# Patient Record
Sex: Male | Born: 1985 | Marital: Married | Smoking: Current every day smoker
Health system: Southern US, Community
[De-identification: ages and names within clinical notes are randomized; demographics above are authoritative.]

## PROBLEM LIST (undated history)

## (undated) DIAGNOSIS — E785 Hyperlipidemia, unspecified: Secondary | ICD-10-CM

## (undated) DIAGNOSIS — M419 Scoliosis, unspecified: Secondary | ICD-10-CM

## (undated) HISTORY — DX: Scoliosis, unspecified: M41.9

## (undated) HISTORY — PX: OTHER SURGICAL HISTORY: SHX169

## (undated) HISTORY — DX: Hyperlipidemia, unspecified: E78.5

---

## 2018-05-18 DIAGNOSIS — N481 Balanitis: Secondary | ICD-10-CM | POA: Diagnosis not present

## 2018-06-15 DIAGNOSIS — N481 Balanitis: Secondary | ICD-10-CM | POA: Diagnosis not present

## 2018-06-15 DIAGNOSIS — N471 Phimosis: Secondary | ICD-10-CM | POA: Diagnosis not present

## 2018-06-21 DIAGNOSIS — H40053 Ocular hypertension, bilateral: Secondary | ICD-10-CM | POA: Diagnosis not present

## 2018-06-21 DIAGNOSIS — H35033 Hypertensive retinopathy, bilateral: Secondary | ICD-10-CM | POA: Diagnosis not present

## 2018-06-21 DIAGNOSIS — H43393 Other vitreous opacities, bilateral: Secondary | ICD-10-CM | POA: Diagnosis not present

## 2018-06-28 DIAGNOSIS — R448 Other symptoms and signs involving general sensations and perceptions: Secondary | ICD-10-CM | POA: Diagnosis not present

## 2018-06-28 DIAGNOSIS — J3489 Other specified disorders of nose and nasal sinuses: Secondary | ICD-10-CM | POA: Diagnosis not present

## 2018-06-28 DIAGNOSIS — H93A2 Pulsatile tinnitus, left ear: Secondary | ICD-10-CM | POA: Diagnosis not present

## 2018-06-29 ENCOUNTER — Ambulatory Visit (INDEPENDENT_AMBULATORY_CARE_PROVIDER_SITE_OTHER): Payer: 59 | Admitting: Family Medicine

## 2018-06-29 ENCOUNTER — Encounter: Payer: Self-pay | Admitting: Family Medicine

## 2018-06-29 ENCOUNTER — Encounter

## 2018-06-29 VITALS — BP 114/70 | HR 71 | Temp 98.6°F | Ht 69.0 in | Wt 251.0 lb

## 2018-06-29 DIAGNOSIS — H43393 Other vitreous opacities, bilateral: Secondary | ICD-10-CM | POA: Diagnosis not present

## 2018-06-29 DIAGNOSIS — H0289 Other specified disorders of eyelid: Secondary | ICD-10-CM | POA: Diagnosis not present

## 2018-06-29 DIAGNOSIS — M419 Scoliosis, unspecified: Secondary | ICD-10-CM | POA: Insufficient documentation

## 2018-06-29 DIAGNOSIS — F17200 Nicotine dependence, unspecified, uncomplicated: Secondary | ICD-10-CM

## 2018-06-29 DIAGNOSIS — E785 Hyperlipidemia, unspecified: Secondary | ICD-10-CM

## 2018-06-29 DIAGNOSIS — N4889 Other specified disorders of penis: Secondary | ICD-10-CM | POA: Diagnosis not present

## 2018-06-29 DIAGNOSIS — R1012 Left upper quadrant pain: Secondary | ICD-10-CM

## 2018-06-29 NOTE — Progress Notes (Signed)
Phone: [phone]  Subjective:  Patient presents today to establish care.  Prior patient with out of state PCP. Chief complaint-noted.   See problem oriented charting  The following were reviewed and entered/updated in epic: Past Medical History:  Diagnosis Date  . Hyperlipidemia    no rx  . Kyphoscoliosis    diagnosed as teengager- wore brace for some time- didnt seem to hepl    Patient Active Problem List   Diagnosis Date Noted  . Hyperlipidemia     Priority: Medium  . Kyphoscoliosis     Priority: Low   Past Surgical History:  Procedure Laterality Date  . none      Family History  Problem Relation Age of Onset  . Heart attack Father        46, smoker  . Hypertension Father   . Hyperlipidemia Father   . Lung cancer Maternal Grandfather        smoker  . Healthy Mother   . Healthy Sister        nosebleeds, polychromasia in blood- seeing hematologist  . Arthritis Maternal Grandmother   . Other Paternal Grandmother        unknown history  . Other Paternal Grandfather        unknown history- possible heart issues     Medications- reviewed and updated Current Outpatient Medications  Medication Sig Dispense Refill  . Triamcinolone Acetonide (TRIAMCINOLONE 0.1 % CREAM : EUCERIN) CREA Apply 1 application topically.     No current facility-administered medications for this visit.     Allergies-reviewed and updated No Known Allergies  Social History   Social History Narrative   Married. 1 daughter 3.5 years in 2019.       IT consultant      Hobbies: time with daughter    ROS--Full ROS was completed Review of Systems  Constitutional: Positive for weight loss (intentional). Negative for chills and fever.  HENT: Positive for tinnitus. Negative for ear discharge, ear pain and nosebleeds.   Eyes: Negative for blurred vision and double vision.  Respiratory: Negative for cough and shortness of breath.   Cardiovascular: Negative for chest pain and  palpitations.  Gastrointestinal: Positive for abdominal pain and diarrhea. Negative for nausea and vomiting.  Genitourinary: Negative for flank pain and hematuria.  Musculoskeletal: Positive for myalgias. Negative for falls.  Skin: Negative for itching and rash.  Neurological: Positive for tingling and headaches. Negative for speech change.  Endo/Heme/Allergies: Negative for polydipsia. Does not bruise/bleed easily.  Psychiatric/Behavioral: Negative for hallucinations and substance abuse.   Objective: BP 114/70 (BP Location: Left Arm, Patient Position: Sitting, Cuff Size: Large)   Pulse 71   Temp 98.6 F (37 C) (Oral)   Ht 5\' 9"  (1.753 m)   Wt 251 lb (113.9 kg)   SpO2 96%   BMI 37.07 kg/m  Gen: NAD, resting comfortably HEENT: Mucous membranes are moist. Oropharynx normal. TM normal. Some brown spots on lips Eyes: sclera and lids normal, PERRLA Neck: no thyromegaly, no cervical lymphadenopathy CV: RRR no murmurs rubs or gallops Lungs: CTAB no crackles, wheeze, rhonchi Abdomen: soft/nontender/nondistended/normal bowel sounds. No rebound or guarding. obese Ext: no edema Skin: warm, dry, 4 brown lesions all under 1 cm on bilateral legs Neuro: 5/5 strength in upper and lower extremities, normal gait, normal reflexes  Assessment/Plan:   Eyelid discomfort Penile Burning LUQ abdominal pain S: in 2014 had a dental cleaning for gingivitis and had deep cleaning. All of his symptoms started after the deep cleaning. He felt "  inflammed" in multiple areas- hip, stomach, penis burning, left eyelid. Then he started getting bad headaches- felt like a balloon being inflamed in his brain. Later had blurry vision- ended up at hospital in 2014- did MRI brain and possibly CT scan- these were reassuring   Worried about HIV- had this ruled out. He saw multiple doctors- GI (told GERD or IBS), neurologist (who mentioned fibromyalgia or psychosomatic), urologist (phimosis noted in 2014 and used cream,  later urology locally- skin pulled back further and was told chronic inflammation in area of brown spot- also has some inflammation near penile tip). He will be following up with a new urologist as his is moving. Has seen optho (signs of allergic symptoms).   ENT locally saw Dr. Blenda Nicely who did another scope- she didn't see anything that needed to be biopsied. She saw large adenoids and possibly that's what he wanted to biopsy in 2009. They also discussed hearing test and MRI.   In 2009 went to ENT for unrelated visit- he did a scope in his nose- they were worried about lymphoma- He felt better after steroids and never followed up. Talked to PCP who said if he had lymphoma for 5 years - wouldn't present with current symptoms of diffuse feeling of inflammation. Some tinnitus- worse with sex- sounds like swooshing- doesn't line up with heart beat  Current symptoms include penile tingling/burning (STD check up in 2014, sexually active with wife only and doesn't suspect infidelity) that comes and goes, eyes always feel inflamed to him and he has some floaters (has seen eye doctor here locally), skin over left upper abdomen feels like balloon inflammed then will disappear (will get dull aches)- nothing at present. Generally feels hot in comparison to others in last few months. Some weight loss but intentional. Infrequent diarrhea. Thyroid has been checked before and normal. Also feels like he sweats a lot. Hyperpigmented areas on lips- ENT doctor saw them and stated they could be biopsied. Mild tingling over each cheek with mild redness. headahes seem to be starting up again- behind both ears. Flonase, zyrtec, no help for burning in eyelids.   He states has had multiple tests like ANA, p anca, c anca and others.   Excessive sweating has been lifelong issue. Has felt tired at times but admits doesn't exerise regularly. Black spots in mouth. Some brown spots on legs- possible Dermatofibroma on leg  A/P: 32  year old male with eyelid burning, penile burning (thought to be related to phimosis per urology), floaters (has seen optho), intermittent LUQ pain- though none today. Has some other and has had even other nonspecific symptoms. He is concerned about all of these symptoms being linked lymphoma which was mentioned by [facility].  Doctor in [place] seemed to have more advanced equipment and exam took longer.   Patient is very concerned about potential for lymphoma and wants to get to the root of all his issues. We talked about doing an MRI sinuses/orbits. He states even if this was normal would not fully reassure him. Talked about returning to [place] ENT doctor who is now in [place]- then he states that he reviewed that doctors notes and they were not very in depth/precise and he is not sure if he would be reassured by another examination by that physician.   I am not sure what would provide peace of mine to patient in regards to multiple nonspecific symptoms. I certainly want to rule out any serious disease like lymphoma. We are going to  start by getting prior workup/records. Reassess in 1 month. Will get labwork at that time and consider MR orbits.   In regards to eyelid burning- no obvious findings. Also mentioned some tingling in cheeks and red rash -not a classic malar rash- may be worth updating ANA if cant find in records. Abdominal exam benign today. Didn't examine penis as following with urology. He has a lesion on his leg which he wonders if could be linked with lymphoma- I think one of these is a dermatofibroma- could biopsy this with punch biopsy potentially vs refer to dermatology- discuss further at follow up. Has some brown spots on lips that he declines biopsy of through ENT.    Need to push for smoking cessation as this is a clear risk factor for MI and dad had heart attack at 69 and given his cancer concern need to reduce cancer risk factors.   Hyperlipidemia S: suspect controlled on no rx A/P: will get  records, may need to get more recent lipids when he comes back for follow up      Future Appointments  Date Time Provider [place]  08/01/2018 11:15 AM Marin Olp, MD [facility]   Time Stamp The duration of face-to-face time during this visit was greater than 30 minutes. Greater than 50% of this time was spent in counseling, explanation of diagnosis, planning of further management, and/or coordination of care including discussion of stress of prior and current symptoms and not knowing current solution, discussing potential next steps, reassuring patient about prior workup, discussing how sometimes we dont find causes and how that can be frustrating for both patient and physician.    Return precautions advised.   Garret Reddish, MD

## 2018-06-29 NOTE — Patient Instructions (Addendum)
Sign release of information at the check out desk for 1. Records from first ENT doctor 2. Prior neurologist 3. Prior gastroenterologist 4. Prior primary care doctor 5. Prior dermatologist visit  Once we have all that we will determine if future blood tests needed, can consider MR orbits/sinuses, can consider derm referral vs. Biopsy of lesion on leg but want to look at prior workup first

## 2018-06-29 NOTE — Assessment & Plan Note (Signed)
S: suspect controlled on no rx A/P: will get records, may need to get more recent lipids when he comes back for follow up

## 2018-07-12 ENCOUNTER — Ambulatory Visit (INDEPENDENT_AMBULATORY_CARE_PROVIDER_SITE_OTHER): Payer: 59 | Admitting: Family Medicine

## 2018-07-12 ENCOUNTER — Other Ambulatory Visit (HOSPITAL_COMMUNITY)
Admission: RE | Admit: 2018-07-12 | Discharge: 2018-07-12 | Disposition: A | Discharge status: Home / Self Care | Payer: 59 | Source: Ambulatory Visit | Attending: Family Medicine | Admitting: Family Medicine

## 2018-07-12 ENCOUNTER — Encounter: Payer: Self-pay | Admitting: Family Medicine

## 2018-07-12 VITALS — BP 118/68 | HR 65 | Temp 99.1°F | Ht 69.0 in | Wt 253.4 lb

## 2018-07-12 DIAGNOSIS — Z113 Encounter for screening for infections with a predominantly sexual mode of transmission: Secondary | ICD-10-CM | POA: Insufficient documentation

## 2018-07-12 DIAGNOSIS — R51 Headache: Secondary | ICD-10-CM

## 2018-07-12 DIAGNOSIS — Z114 Encounter for screening for human immunodeficiency virus [HIV]: Secondary | ICD-10-CM

## 2018-07-12 DIAGNOSIS — G8929 Other chronic pain: Secondary | ICD-10-CM

## 2018-07-12 DIAGNOSIS — E785 Hyperlipidemia, unspecified: Secondary | ICD-10-CM | POA: Diagnosis not present

## 2018-07-12 DIAGNOSIS — Z118 Encounter for screening for other infectious and parasitic diseases: Secondary | ICD-10-CM | POA: Insufficient documentation

## 2018-07-12 LAB — POC URINALSYSI DIPSTICK (AUTOMATED)
Bilirubin, UA: NEGATIVE
Blood, UA: NEGATIVE
GLUCOSE UA: NEGATIVE
Ketones, UA: NEGATIVE
Leukocytes, UA: NEGATIVE
NITRITE UA: NEGATIVE
PH UA: 6 (ref 5.0–8.0)
Protein, UA: NEGATIVE
Spec Grav, UA: 1.02 (ref 1.010–1.025)
UROBILINOGEN UA: 0.2 U/dL

## 2018-07-12 NOTE — Patient Instructions (Addendum)
Health Maintenance Due  Topic Date Due  . INFLUENZA VACCINE -please call our office to schedule this in October/November 06/02/2018   Please stop by lab before you go  We will call you within two weeks about your referral to MR brain and orbits. If you do not hear within 3 weeks, give [place] a call.   See me back for new or worsening symptoms - lets plan on 2 month visit regardless of results and workup

## 2018-07-12 NOTE — Progress Notes (Signed)
Subjective:  Brandon Moody is a 32 y.o. year old very pleasant male patient who presents for/with See problem oriented charting ROS- blurry vision, floaters, headaches, penile burning, LUQ pain intermittently. States slightly high temperature but no fever   Past Medical History-  Patient Active Problem List   Diagnosis Date Noted  . Hyperlipidemia     Priority: Medium  . Kyphoscoliosis     Priority: Low  . Smoker 06/29/2018    Medications- reviewed and updated Current Outpatient Medications  Medication Sig Dispense Refill  . Triamcinolone Acetonide (TRIAMCINOLONE 0.1 % CREAM : EUCERIN) CREA Apply 1 application topically.     No current facility-administered medications for this visit.     Objective: BP 118/68 (BP Location: Left Arm, Patient Position: Sitting, Cuff Size: Large)   Pulse 65   Temp 99.1 F (37.3 C) (Oral)   Ht '5\' 9"'  (1.753 m)   Wt 253 lb 6.4 oz (114.9 kg)   SpO2 97%   BMI 37.42 kg/m  Gen: NAD, resting comfortably No erythema over cheeks, no obvious discharge from eyes CV: RRR  Lungs: nonlabored, normal respiratory rate Abdomen: soft/nondistended Ext: no edema Skin: warm, dry GU: hard retracting foreskin (has been told phimosis)- brown plaque at base of penis under 1 x 1 cm- told benign by urology  Assessment/Plan:  Chronic intractable headache, unspecified headache type - Plan: MR Brain W Wo Contrast, MR ORBITS W WO CONTRAST S: Patient presents for follow up today. In size 10 font is review of prior records We were unable to get much in way of records but he brings many labs from prior workups and the note from 10/05/18 related to his concern  About possible lymphoma from ENT physician. Note states 6 weeks of sinusitis type symptoms- which included at beginning low grade fever and intermittent sore throat and later on had fullness and pressure within both ears and tinnitus. He was getting some facial pain at tat time which radiated into the back of his  head. He had noted intermittent hair loss and intermittent axillary lymphadenopathy (though no exam of lymph nodes noted). Apparently sinus symptoms started 5-7 days after high risk sexual encounter for which STD testing was negative. That note contains a full endoscopy report (which will be scanned in) with no obvious abnormalities other than a deviated septum, adenoid pad in nasopharynx, posterior pharyngeal wall cobblestoning. The assessment and plan  "Mr. Brandon Moody is a 32 year old gentleman with some concerning persistent sinus and systemic symptoms. I have recommended a three week course of antibotic and also a medrol dose pack. I did discuss with him the possibility that this coule be related to a lymphoma or a type of blood cancer. I have asked him to please have your office forward me the resutls of his most recent blood tests. I am planning him for a CAT scan if he is not improved after the course of therapy. I will see him back in two weeks ". Please note patient was actually 22 at the time but I am quoting the note.   I did not scan in copy as intended but also had EEG by neurologist for prior blurred vision complaints  On 11/29/12 had TSh normal but T4 hair high at 1.9 (normal 0.8-1.8), slightly high WBC count, slightly high platelet count, high absolute neutorphils, urine with ketones , nitrates, bacteria, calcium oxalate crystals. RPR negative. A1c not elevated. chalmydia tr, negative chalmydia trachomatis antibodies (igm, igg, iga), negative antiodies to gonorrhea. Urine culture showed  no true UTI- multiple organisms present.   On 12/02/12 had normal hepatitis panel, negative ANA, negative mitochondrial antibody, negative smooth muscle antibody.   On 12/30/12 had normal b12, folate, tsh, t4, lyme antodies negative, ana negative. Also had negative anti- myelin assoc glycoprotein, normal MPO antibodies, normal antiproteinase 3 ABs, normal C ANCA and normal P anca, ace level normal, ivtamin b1  normal, normal DNA double stranded, negative hla-b27, normal anti centromere B antibodies, normal anti- meylin assoc. Glycoprteins.   On 01/09/13 patient had labs which included normal CBC, cmp (other than ALT 80, BUN and creainine being slightly low), ESR of 9. LDH normal. Ferritin normal. Ca 19-9 normal, CEA normal, alpha fetoprotein normal, normal IGG,A,M levels, normal fre kappa, free lambda, kappa/lambda ratio, copper levels. He states these were done by a hematologist  On 04/11/13 he had normal CMP other than slightly high ALT, CCP IGG, RF, CRP, GGT, LD, TSH, uric acid, UA, CBC other than mild leukocytosis at 11.2 but with normal distribution, ESR, DS DNA, parvovirus, SM/RNP AB, HCV, SSA-SSB, centromere C3/C4. DID have ANA which was 1:40 but unknown pattern- but as noted several oter negatives  On 7-7/-14 he had a ct sinuses without contrast showing "lmited development of the frontal and right sphenoid sinuses, curvature of anterior cartilaginous septum to the right, congenital narrowing of the anterior OMC, minor inflammatory changes in the left antrum and left posterior ethmoid bed, mild prominence of lymphoid tissue in the roof fo the nasopharynx.   Today, patient continues to complain of headaches- states started since 2014 but have bene much more prominent in last few weeks. Also with Tingling in back of scalp, burning sensation in eyelids and cheeks, floaters. Temp tends to run around 99.1 or 99.3 . Has some white discharge from eyes and has been told by optho- allergic conjunctivitis. He states he is worried this is lymphoma dating back to 2009 discussion. He is not convinced by # of years or intermittency of symptoms. 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 He wonders if had lymphoma in mouth and and sent distally during . I think he links the upper abdominal pain to possible abdominal manifestations of MALT lymphoma or extranodal marginal zone lymphoma (EMZL). He is  concerned skin lesions on penis and lower legs could be cutaneous manifestations. This disease process is often linked to autoimmune conditions- which he has note  He still has Stinging at times on penile tip or LLQ- describes as inflamming senation. Has brown plaque on head of penis- not sure exactly how long it has been there but urology thought benign.   ging back to history patient states originally sent ot ENT as wanted further evaluation for chlamydia pneumonia - treating this apparently caused resolution of initial symptoms (I dont have records for that).   He is not ready to quit smoking despite counseling of risks and potential risks to developing a cancer or fueling one . A/P: After extended conversation and counseling today- we opted to proceed forward with MRI of brain for headaches and of orbits considering his concern for mucosal related lymphoma. I told him if this was negative I would suggest seeing his prior ENT doctor from [place] (who now has moved out of state of [place]). Dr. Johny Chess ([email or web address]). I also told patient I may start by simply calling the physician and going over his previous note. I told patient we need to go back to the source of his lymphoma concern- the only thing from note  I see linking to possible lymphoma is actually intermittent axillary lymphadenopathy which patient does not complain of at this time.   I told patient that I certainly believe he has these symptoms- my concern is that there may be magnified psychological stress due to his focus on prior mention of lymphoma (which I understand can be scary to him)   With penile burning we also screened for STDs today (doubt this is cause in monogamous relationship)   Future Appointments  Date Time Provider [place]  09/12/2018 11:15 AM Marin Olp, MD [facility]   Lab/Order associations: Hyperlipidemia, unspecified hyperlipidemia type - Plan: CBC,  Comprehensive metabolic panel, Lipid panel, POCT Urinalysis Dipstick (Automated)  Screening for HIV (human immunodeficiency virus) - Plan: HIV antibody  Screening for venereal disease - Plan: RPR  Screening for gonorrhea - Plan: Urine cytology ancillary only, Urine cytology ancillary only  Screening for chlamydial disease - Plan: Urine cytology ancillary only, Urine cytology ancillary only  Chronic intractable headache, unspecified headache type - Plan: MR Brain W Wo Contrast, MR ORBITS W WO CONTRAST  Time Stamp The duration of face-to-face time during this visit was greater than 40 minutes (2:57 PM to 3:38 PM) Greater than 50% of this time was spent in counseling, explanation of diagnosis, planning of further management, and/or coordination of care including counseling on how difficult it must be to have been told possible lymphoma and have various symptoms which concern him for such, reiterating that the probability of lymphoma is very low, working through creating plan for next steps including possibly discussing or sending him back to his prior ENT doctor.   Return precautions advised.  Garret Reddish, MD

## 2018-07-13 LAB — COMPREHENSIVE METABOLIC PANEL
ALT: 53 U/L (ref 0–53)
AST: 23 U/L (ref 0–37)
Albumin: 4.7 g/dL (ref 3.5–5.2)
Alkaline Phosphatase: 70 U/L (ref 39–117)
BUN: 12 mg/dL (ref 6–23)
CHLORIDE: 104 meq/L (ref 96–112)
CO2: 27 meq/L (ref 19–32)
Calcium: 9.7 mg/dL (ref 8.4–10.5)
Creatinine, Ser: 0.67 mg/dL (ref 0.40–1.50)
GFR: 146.65 mL/min (ref >60.00)
Glucose, Bld: 80 mg/dL (ref 70–99)
POTASSIUM: 4.4 meq/L (ref 3.5–5.1)
Sodium: 140 mEq/L (ref 135–145)
Total Bilirubin: 0.5 mg/dL (ref 0.2–1.2)
Total Protein: 7.3 g/dL (ref 6.0–8.3)

## 2018-07-13 LAB — CBC
HEMATOCRIT: 48.3 % (ref 39.0–52.0)
HEMOGLOBIN: 16.2 g/dL (ref 13.0–17.0)
MCHC: 33.5 g/dL (ref 30.0–36.0)
MCV: 84.1 fl (ref 78.0–100.0)
Platelets: 311 10*3/uL (ref 150.0–400.0)
RBC: 5.75 Mil/uL (ref 4.22–5.81)
RDW: 14.2 % (ref 11.5–15.5)
WBC: 9.3 10*3/uL (ref 4.0–10.5)

## 2018-07-13 LAB — LIPID PANEL
CHOL/HDL RATIO: 6
Cholesterol: 207 mg/dL — ABNORMAL HIGH (ref 0–200)
HDL: 34.8 mg/dL — AB (ref >39.00)
NONHDL: 172.07
Triglycerides: 283 mg/dL — ABNORMAL HIGH (ref 0.0–149.0)
VLDL: 56.6 mg/dL — AB (ref 0.0–40.0)

## 2018-07-13 LAB — LDL CHOLESTEROL, DIRECT: Direct LDL: 151 mg/dL

## 2018-07-13 LAB — RPR: RPR Ser Ql: NONREACTIVE

## 2018-07-13 LAB — HIV ANTIBODY (ROUTINE TESTING W REFLEX): HIV: NONREACTIVE

## 2018-07-14 LAB — URINE CYTOLOGY ANCILLARY ONLY
Chlamydia: NEGATIVE
NEISSERIA GONORRHEA: NEGATIVE
TRICH (WINDOWPATH): NEGATIVE

## 2018-07-19 ENCOUNTER — Encounter: Payer: Self-pay | Admitting: Family Medicine

## 2018-07-20 DIAGNOSIS — Z0110 Encounter for examination of ears and hearing without abnormal findings: Secondary | ICD-10-CM | POA: Diagnosis not present

## 2018-07-20 DIAGNOSIS — H9319 Tinnitus, unspecified ear: Secondary | ICD-10-CM | POA: Diagnosis not present

## 2018-07-20 DIAGNOSIS — H9312 Tinnitus, left ear: Secondary | ICD-10-CM | POA: Diagnosis not present

## 2018-07-28 ENCOUNTER — Ambulatory Visit (INDEPENDENT_AMBULATORY_CARE_PROVIDER_SITE_OTHER): Payer: 59 | Admitting: Family Medicine

## 2018-07-28 ENCOUNTER — Encounter: Payer: Self-pay | Admitting: Family Medicine

## 2018-07-28 VITALS — BP 124/80 | HR 84 | Temp 98.9°F | Ht 69.0 in | Wt 247.4 lb

## 2018-07-28 DIAGNOSIS — H43393 Other vitreous opacities, bilateral: Secondary | ICD-10-CM

## 2018-07-28 DIAGNOSIS — H0289 Other specified disorders of eyelid: Secondary | ICD-10-CM

## 2018-07-28 DIAGNOSIS — R1012 Left upper quadrant pain: Secondary | ICD-10-CM

## 2018-07-28 NOTE — Patient Instructions (Addendum)
Steps for work up 1. Lets have you return to see the eye doctor given worsening floaters and the bump under left eyelid. Should be ophthalmology not optometry in case intervention needed.  2. Get penile biopsy- go ahead and schedule this- I know you are tough enough to get through this and hoping for reassuring results 3. Schedule with a dentist in your network- make sure they are aware of your concerns and do not do a dental cleaning but do what they need to do to evaluate the enlargement of tissue (I think related to tooth being embedded in tissue and causing inflammation) 4. Call for your MRIs- see handout  Once you have all those completed lets regroup and discuss next steps. See me back sooner for new or worsening symptoms

## 2018-07-28 NOTE — Progress Notes (Signed)
Subjective:  Brandon Moody is a 32 y.o. year old very pleasant male patient who presents for/with See problem oriented charting ROS- patient with floaters, burning sensation both eyelids, LUQ pain. Has had slightly elevated temperatures.   Past Medical History-  Patient Active Problem List   Diagnosis Date Noted  . Hyperlipidemia     Priority: Medium  . Kyphoscoliosis     Priority: Low  . Smoker 06/29/2018   Medications- reviewed and updated, no rx  Objective: BP 124/80 (BP Location: Left Arm, Patient Position: Sitting, Cuff Size: Normal)   Pulse 84   Temp 98.9 F (37.2 C) (Oral)   Ht 5\' 9"  (1.753 m)   Wt 247 lb 6.4 oz (112.2 kg)   SpO2 97%   BMI 36.53 kg/m  Gen: NAD, resting comfortably Some whitish discoloration at top of gum lines throughout both sides of teeth- some hypertrophy of tissue behind molar on right CV: RRR no murmurs rubs or gallops Lungs: CTAB no crackles, wheeze, rhonchi Abdomen: soft/nontender/nondistended/normal bowel sounds. No rebound or guarding.  Ext: no edema Skin: warm, dry Psych: tearful during visit concerned about lymphoma  Assessment/Plan:  Vitreous floaters of both eyes  LUQ pain  Eyelid pain of both eyes S: Feeling fatigued lately. Has left upper quadrant pain- starting back in July and seems to be getting slightly worse. Also getting floaters still- feels more prevalent. For about a week and a half to 2 weeks noted higher temperatures- highest it has been 100.1. Noted a bump under left eyelid- thinks it may have been there previously- eye doctor looked there previously and hes not sure if it was seen at that time. Does have some nasal congestion but daughter was ill recently and his symptoms seem to be getting better.   Urology- saw him and plan for biopsy of skin changes under anesthesia.   He also notes some tissue enlargement above right molar. Very fearful of seeing dentist because most of symptoms started after a dental cleaning  years ago. He is concerned that he had a lymphoma that was showered through his body by the dental cleaning. He has a bulge of tissue at base of his right molar but scared to go to dentist to have evaluated.  A/P: Patient continues to be very fearful that his constellation of symptoms is related to a now diffuse lymphoma that he believes started in nasal passages when he saw ENT over 10 years ago and the word lymphoma was mentioned and possible biopsy which he never got. Extended counseling needed again today. I fully believe he is experiencing these symptoms but believe they are amplified by his anxiety about possible lymphoma. I told him I did not think this represented lymphoma (nor does any prior extensive workup suggest that). Even for nasal passages has had 2 different ENT evaluate and they have not seen what first ENT physician saw years ago- once again if workup below negative I plan to contact that physician. This is really causing psychological stress on patient that I would love to help alleviate (and of course provide more convincing evidence to him of no lymphoma).   I suspect he may have a URI as well at present that is improving.   From avs  "Steps for work up 1. Lets have you return to see the eye doctor given worsening floaters and the bump under left eyelid. Should be ophthalmology not optometry in case intervention needed.  2. Get penile biopsy- go ahead and schedule this- I know you  are tough enough to get through this and hoping for reassuring results 3. Schedule with a dentist in your network- make sure they are aware of your concerns and do not do a dental cleaning but do what they need to do to evaluate the enlargement of tissue (I think related to tooth being embedded in tissue and causing inflammation) 4. Call for your MRIs- see handout  Once you have all those completed lets regroup and discuss next steps. See me back sooner for new or worsening symptoms"  Future Appointments   Date Time Provider [place]  08/07/2018 10:20 AM [facility] MR 1 [id number]  08/07/2018 11:00 AM [facility] MR 1 [id number]  09/12/2018 11:15 AM Marin Olp, MD [facility]   Time Stamp The duration of face-to-face time during this visit was greater than 25 minutes. Greater than 50% of this time was spent in counseling, explanation of diagnosis, planning of further management, and/or coordination of care including discussion of his fears about lymphoma, possible alternate causes- various symptoms amplified by anxiety, discussing importance of following through with steps above to help alleviate concerns.    Return precautions advised.  Garret Reddish, MD

## 2018-08-01 ENCOUNTER — Ambulatory Visit: Payer: 59 | Admitting: Family Medicine

## 2018-08-03 DIAGNOSIS — H029 Unspecified disorder of eyelid: Secondary | ICD-10-CM | POA: Diagnosis not present

## 2018-08-03 DIAGNOSIS — H43393 Other vitreous opacities, bilateral: Secondary | ICD-10-CM | POA: Diagnosis not present

## 2018-08-03 DIAGNOSIS — H35033 Hypertensive retinopathy, bilateral: Secondary | ICD-10-CM | POA: Diagnosis not present

## 2018-08-07 ENCOUNTER — Ambulatory Visit
Admission: RE | Admit: 2018-08-07 | Discharge: 2018-08-07 | Disposition: A | Discharge status: Home / Self Care | Payer: 59 | Source: Ambulatory Visit | Attending: Family Medicine | Admitting: Family Medicine

## 2018-08-07 DIAGNOSIS — I619 Nontraumatic intracerebral hemorrhage, unspecified: Secondary | ICD-10-CM | POA: Diagnosis not present

## 2018-08-07 DIAGNOSIS — G8929 Other chronic pain: Secondary | ICD-10-CM

## 2018-08-07 DIAGNOSIS — R51 Headache: Principal | ICD-10-CM

## 2018-08-07 DIAGNOSIS — H43393 Other vitreous opacities, bilateral: Secondary | ICD-10-CM | POA: Diagnosis not present

## 2018-08-07 MED ORDER — GADOBENATE DIMEGLUMINE 529 MG/ML IV SOLN
20.0000 mL | Freq: Once | INTRAVENOUS | Status: AC | PRN
  Administered 2018-08-07: 20 mL via INTRAVENOUS

## 2018-08-09 ENCOUNTER — Other Ambulatory Visit: Payer: Self-pay | Admitting: Family Medicine

## 2018-08-09 DIAGNOSIS — E348 Other specified endocrine disorders: Secondary | ICD-10-CM

## 2018-08-16 ENCOUNTER — Encounter: Payer: Self-pay | Admitting: Family Medicine

## 2018-08-18 ENCOUNTER — Encounter: Payer: Self-pay | Admitting: Neurology

## 2018-08-22 DIAGNOSIS — D210 Benign neoplasm of connective and other soft tissue of head, face and neck: Secondary | ICD-10-CM | POA: Diagnosis not present

## 2018-08-22 DIAGNOSIS — H04022 Chronic dacryoadenitis, left lacrimal gland: Secondary | ICD-10-CM | POA: Diagnosis not present

## 2018-08-22 DIAGNOSIS — H02844 Edema of left upper eyelid: Secondary | ICD-10-CM | POA: Diagnosis not present

## 2018-09-12 ENCOUNTER — Ambulatory Visit: Payer: 59 | Admitting: Family Medicine

## 2018-09-13 ENCOUNTER — Encounter: Payer: Self-pay | Admitting: Family Medicine

## 2018-09-13 ENCOUNTER — Ambulatory Visit (INDEPENDENT_AMBULATORY_CARE_PROVIDER_SITE_OTHER): Payer: 59 | Admitting: Family Medicine

## 2018-09-13 VITALS — BP 126/88 | HR 78 | Temp 98.0°F | Ht 69.0 in | Wt 257.8 lb

## 2018-09-13 DIAGNOSIS — E348 Other specified endocrine disorders: Secondary | ICD-10-CM

## 2018-09-13 DIAGNOSIS — R51 Headache: Secondary | ICD-10-CM

## 2018-09-13 DIAGNOSIS — H43393 Other vitreous opacities, bilateral: Secondary | ICD-10-CM

## 2018-09-13 DIAGNOSIS — G8929 Other chronic pain: Secondary | ICD-10-CM

## 2018-09-13 DIAGNOSIS — H0289 Other specified disorders of eyelid: Secondary | ICD-10-CM

## 2018-09-13 NOTE — Patient Instructions (Addendum)
Health Maintenance Due  Topic Date Due  . INFLUENZA VACCINE -pt declines for today 06/02/2018   Thanks for your time today  I am glad you are following up with neurology. I think getting the gum biopsy is the logical next choice but also sounds like you need the biopsy of eyelid and penis as well eventually.   Hold off on bloodwork unless significant change in symptoms  As always- quitting smoking will reduce other long term health risks  Let us know if you have worsening symptoms

## 2018-09-13 NOTE — Progress Notes (Signed)
Subjective:  Brandon Moody is a 32 y.o. year old very pleasant male patient who presents for/with See problem oriented charting ROS- continued floaters, pressure feeling in back of head, frontal headache as well. Some swelling feeling in penis and behind eyes.    Past Medical History-  Patient Active Problem List   Diagnosis Date Noted  . Hyperlipidemia     Priority: Medium  . Kyphoscoliosis     Priority: Low  . Smoker 06/29/2018    Medications- reviewed and updated Current Outpatient Medications  Medication Sig Dispense Refill  . Aspirin-Salicylamide-Caffeine (BC HEADACHE POWDER PO) Take by mouth.     No current facility-administered medications for this visit.     Objective: BP 126/88 (BP Location: Left Arm, Patient Position: Sitting, Cuff Size: Large)   Pulse 78   Temp 98 F (36.7 C) (Oral)   Ht 5\' 9"  (1.753 m)   Wt 257 lb 12.8 oz (116.9 kg)   SpO2 98%   BMI 38.07 kg/m  Gen: NAD, resting comfortably  Assessment/Plan:  Floaters Pineal gland cyst Penile discoloration Burning sensation in eyes, pressure sensation in heat, inflammed feeling on penis S: Patient continues to be very focused on what he was told 10 years ago by ENT Dr. Jodelle Green that there was a possibility of lymphoma and biopsy may be needed. He relates this then to a dental scaling/cleaning 6 years ago where he started to have multiple symptoms of feeling "inflammed" all over. He feels like everywhere he had this feeling he later developed issues. He is convinced this could all be related to an undiagnosed lymphoma or MALT lymphoma specifically that is slow growing yet spreading. He also has fear of biopsies for concern of potentially spreading. He is convinced he has a slow growing process- he does not believe symptoms could be nonspecific not connected patterns. 6 years ago he saw multiple doctors and no clear cause found but he still does not have peace about this. He wants to potentially pay to see a  lymphoma specialist at tertiary care center it seems.   Since last visit.  1. Saw Oculofacial surgeon-  wants to cut into conjunctiva for biopsy. Saw optho- continues to have floaters. He connects this to swelling feeling of his eye/floaters.  2. Had MRI which showed pineal gland cyst- he is convinced this could have come from lymphoma as he had MRI 6 years ago where didn't have pineal gland cyst. He states has felt inflamed in is head and then thus relates pineal gland cyst to prior scaling. He is Getting some headaches from 3 days before halloween. Ibuprofen, Excedrin, tylenol - right frontal. Gets pressure in back of head/odd sensation/swelling sensation  3. Has seen urology and recommended biopsy of discoloration on penis- patient is declinging this for now. Links this to inflamed feeling on penis he has had in past.  4. Saw dentist - then sent to Oral surgeon wants to take a piece of gum- white on gums on right and left 5. 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  A/P: Stable symptoms- patient with high anxiety about symptoms as well as potential diagnosis of lymphoma though at same time he does not want to move forward with testing worried it could "spread" issues further.  I encouraged patient to proceed forward with gum biopsy then eyelid then penis since he  seems most comfortable with that pattern. He seems to want to se neurology first then consider next steps.   I am going to try to reach Dr. [place]odelle [place]- I wonder if pateitn ultimately needs repeat scope from him so he can look at original area patient is concerned could be related to [place] though 2 other ENT have seen him since that time and not seen prior reported concerning area.   From prior visit "I would suggest seeing his prior ENT doctor from [place]  (who now has moved out of state of [place]). Dr. Johny Chess ([email or web address]). I also told patient I may start by simply calling the physician and going over his previous note. I told patient we need to go back to the source of his lymphoma concern- the only thing from note I see linking to possible lymphoma is actually intermittent axillary lymphadenopathy which patient does not complain of at this time. "   Future Appointments  Date Time Provider [place]  10/24/2018  9:10 AM Pieter Partridge, DO [facility] None   Time Stamp The duration of face-to-face time during this visit was greater than 25 minutes. Greater than 50% of this time was spent in counseling, explanation of diagnosis, planning of further management, and/or coordination of care including discussion of patient's concern, long term anxiety related to current and prior symptoms, discussion of reasons for next steps as well as patients reasons for avoidance.    Return precautions advised.  Garret Reddish, MD

## 2018-09-15 ENCOUNTER — Telehealth: Payer: Self-pay | Admitting: Family Medicine

## 2018-09-15 NOTE — Telephone Encounter (Signed)
I spoke with Dr. Jodelle Green yesterday  He stated that the mention of possible lymphoma was related to the adenoid pad which was present though it appeared normal. He states he always writes if it persists past age 32 and patient is symptomatic about chance of lymphoma- since patients symptoms improved for 4 years after antibiotics and prednisone the probability of his current symptoms being related to a lymphoma linked to adenoid pad is very low.   Spoke with patient and explained this. He still has an adenoid pad per recent ENT visit but she did not suggest biopsy with normal appearance.   It old patient this reassured me further that is symptoms were not related to lymphoma. He can still proceed forward with biopsies as discussed during his visit earlier this week if he would like.

## 2018-10-12 ENCOUNTER — Ambulatory Visit (INDEPENDENT_AMBULATORY_CARE_PROVIDER_SITE_OTHER): Payer: 59 | Admitting: Family Medicine

## 2018-10-12 ENCOUNTER — Encounter: Payer: Self-pay | Admitting: Family Medicine

## 2018-10-12 VITALS — BP 128/72 | HR 93 | Temp 98.7°F | Ht 69.0 in | Wt 260.8 lb

## 2018-10-12 DIAGNOSIS — H66001 Acute suppurative otitis media without spontaneous rupture of ear drum, right ear: Secondary | ICD-10-CM | POA: Diagnosis not present

## 2018-10-12 DIAGNOSIS — H6592 Unspecified nonsuppurative otitis media, left ear: Secondary | ICD-10-CM | POA: Diagnosis not present

## 2018-10-12 MED ORDER — AMOXICILLIN-POT CLAVULANATE 875-125 MG PO TABS: 1.0000 | ORAL_TABLET | Freq: Two times a day (BID) | ORAL | 0 refills | Status: AC

## 2018-10-12 NOTE — Progress Notes (Signed)
Subjective:  Brandon Moody is a 32 y.o. year old very pleasant male patient who presents for/with See problem oriented charting ROS-had fever earlier this week but none recently.  Complains of ear fullness and hearing loss on the left.  Complains of ear pain on the right.  No chest pain or shortness of breath reported.  Past Medical History-  Patient Active Problem List   Diagnosis Date Noted  . Hyperlipidemia     Priority: Medium  . Kyphoscoliosis     Priority: Low  . Smoker 06/29/2018   Medications- reviewed and updated Current Outpatient Medications  Medication Sig Dispense Refill  . Aspirin-Acetaminophen-Caffeine (EXCEDRIN EXTRA STRENGTH PO) Take by mouth.     Objective: BP 128/72 (BP Location: Left Arm, Patient Position: Sitting, Cuff Size: Large)   Pulse 93   Temp 98.7 F (37.1 C) (Oral)   Ht 5\' 9"  (1.753 m)   Wt 260 lb 12 oz (118.3 kg)   SpO2 98%   BMI 38.51 kg/m  Gen: NAD, appears fatigued, nasal sounding voice Right tympanic membrane erythematous and cloudy.  Left tympanic membrane with clear and prominent air-fluid level at the top of it-without obvious redness.  Nasal turbinates with clear discharge and mild erythema.  Pharynx mildly erythematous with enlarged tonsils CV: RRR no murmurs rubs or gallops Lungs: CTAB no crackles, wheeze, rhonchi Ext: no edema Skin: warm, dry  Assessment/Plan:   Non-recurrent acute suppurative otitis media of right ear without spontaneous rupture of tympanic membrane  Left otitis media with effusion  S: 2-3 nights ago (thinks Sunday or Monday night) mild sore throat then got fever 2-3 days around 8 PM- 101.7. Took some nyquil up until last night but noted worsening pain in left ear- feels clogged sensation since yesterday. Also has left ear pain- but gets better with nyquil. Feels congested in nasal passages some help with nyquil. Some sinus pressure.  A/P: From AVS:  " Looks like a right ear infection  Unfortunately also  have fluid in the left ear  Your sinuses seem congested as well- lets trial augmentin which will cover the ear infection on the right and also sinusitis if it were bacterial. I am hoping clearing infection will help things drain so your left ear will feel better.   Im willing to call in prednisone on Monday if you would like to trial it and remain uncomfortable- could also try to get you in with local ENT if you would like since didn't have best fit at [facility]  Unfortunately these issues can often last a month or longer but iknow how miserable it makes you so trying to be aggressive "  Future Appointments  Date Time Provider [place]  10/24/2018  9:10 AM Pieter Partridge, DO [facility] None   Meds ordered this encounter  Medications  . amoxicillin-clavulanate (AUGMENTIN) 875-125 MG tablet    Sig: Take 1 tablet by mouth 2 (two) times daily for 7 days.    Dispense:  14 tablet    Refill:  0   Return precautions advised.  Garret Reddish, MD

## 2018-10-12 NOTE — Patient Instructions (Addendum)
Looks like a right ear infection  Unfortunately also have fluid in the left ear  Your sinuses seem congested as well- lets trial augmentin which will cover the ear infection on the right and also sinusitis if it were bacterial. I am hoping clearing infection will help things drain so your left ear will feel better.   Im willing to call in prednisone on Monday if you would like to trial it and remain uncomfortable- could also try to get you in with local ENT if you would like since didn't have best fit at [facility]  Unfortunately these issues can often last a month or longer but iknow how miserable it makes you so trying to be aggressive   Otitis Media With Effusion, Pediatric Otitis media with effusion (OME) occurs when there is inflammation of the middle ear and fluid in the middle ear space. There are no signs and symptoms of infection. The middle ear space contains air and the bones for hearing. Air in the middle ear space helps to transmit sound to the brain. OME is a common condition in children, and it often occurs after an ear infection. This condition may be present for several weeks or longer after an ear infection. Most cases of this condition get better on their own. What are the causes? OME is caused by a blockage of the eustachian tube in one or both ears. These tubes drain fluid in the ears to the back of the nose (nasopharynx). If the tissue in the tube swells up (edema), the tube closes. This prevents fluid from draining. Blockage can be caused by:  Ear infections.  Colds and other upper respiratory infections.  Allergies.  Irritants, such as tobacco smoke.  Enlarged adenoids. The adenoids are areas of soft tissue located high in the back of the throat, behind the nose and the roof of the mouth. They are part of the body's natural defense (immune) system.  A mass in the nasopharynx.  Damage to the ear caused by pressure changes (barotrauma).  What increases the  risk? Your child is more likely to develop this condition if:  He or she has repeated ear and sinus infections.  He or she has allergies.  He or she is exposed to tobacco smoke.  He or she attends daycare.  He or she is not breastfed.  What are the signs or symptoms? Symptoms of this condition may not be obvious. Sometimes this condition does not have any symptoms, or symptoms may overlap with those of a cold or upper respiratory tract illness. Symptoms of this condition include:  Temporary hearing loss.  A feeling of fullness in the ear without pain.  Irritability or agitation.  Balance (vestibular) problems.  As a result of hearing loss, your child may:  Listen to the TV at a loud volume.  Not respond to questions.  Ask "What?" often when spoken to.  Mistake or confuse one sound or word for another.  Perform poorly at school.  Have a poor attention span.  Become agitated or irritated easily.  How is this diagnosed? This condition is diagnosed with an ear exam. Your child's health care provider will look inside your child's ear with an instrument (otoscope) to check for redness, swelling, and fluid. Other tests may be done, including:  A test to check the movement of the eardrum (pneumatic otoscopy). This is done by squeezing a small amount of air into the ear.  A test that changes air pressure in the middle ear  to check how well the eardrum moves and to see if the eustachian tube is working (tympanogram).  Hearing test (audiogram). This test involves playing tones at different pitches to see if your child can hear each tone.  How is this treated? Treatment for this condition depends on the cause. In many cases, the fluid goes away on its own. In some cases, your child may need a procedure to create a hole in the eardrum to allow fluid to drain (myringotomy) and to insert small drainage tubes (tympanostomy tubes) into the eardrums. These tubes help to drain fluid  and prevent infection. This procedure may be recommended if:  OME does not get better over several months.  Your child has many ear infections within several months.  Your child has noticeable hearing loss.  Your child has problems with speech and language development.  Surgery may also be done to remove the adenoids (adenoidectomy). Follow these instructions at home:  Give over-the-counter and prescription medicines only as told by your child's health care provider.  Keep children away from any tobacco smoke.  Keep all follow-up visits as told by your child's health care provider. This is important. How is this prevented?  Keep your child's vaccinations up to date. Make sure your child gets all recommended vaccinations, including a pneumonia and flu vaccine.  Encourage hand washing. Your child should wash his or her hands often with soap and water. If there is no soap and water, he or she should use hand sanitizer.  Avoid exposing your child to tobacco smoke.  Breastfeed your baby, if possible. Babies who are breastfed as long as possible are less likely to develop this condition. Contact a health care provider if:  Your child's hearing does not get better after 3 months.  Your child's hearing is worse.  Your child has ear pain.  Your child has a fever.  Your child has drainage from the ear.  Your child is dizzy.  Your child has a lump on his or her neck. Get help right away if:  Your child has bleeding from the nose.  Your child cannot move part of her or his face.  Your child has trouble breathing.  Your child cannot smell.  Your child develops severe congestion.  Your child develops weakness.  Your child who is younger than 3 months has a temperature of 100F (38C) or higher. Summary  Otitis media with effusion (OME) occurs when there is inflammation of the middle ear and fluid in the middle ear space.  This condition is caused by blockage of one or  both eustachian tubes, which drain fluid in the ears to the back of the nose.  Symptoms of this condition can include temporary hearing loss, a feeling of fullness in the ear, irritability or agitation, and balance (vestibular) problems. Sometimes, there are no symptoms.  This condition is diagnosed with an ear exam and tests, such as pneumatic otoscopy, tympanogram, and audiogram.  Treatment for this condition depends on the cause. In many cases, the fluid goes away on its own. This information is not intended to replace advice given to you by your health care provider. Make sure you discuss any questions you have with your health care provider. Document Released: 01/09/2004 Document Revised: 09/10/2016 Document Reviewed: 09/10/2016 Elsevier Interactive Patient Education  2017 [employer].

## 2018-10-19 ENCOUNTER — Encounter: Payer: Self-pay | Admitting: Family Medicine

## 2018-10-21 ENCOUNTER — Other Ambulatory Visit: Payer: Self-pay

## 2018-10-21 DIAGNOSIS — H9192 Unspecified hearing loss, left ear: Secondary | ICD-10-CM

## 2018-10-21 NOTE — Progress Notes (Signed)
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  History supplemented by ENT and referring provider notes.  Over the past several months, he started experiencing intermittent blurred vision and bilateral facial swelling with burning in the face, pressure in the head and left eyelid lasting minutes to hours with no apparent association.  He reports it started after a dental deep cleaning in 2014.  He also endorsed pain in the hip, stomach and burning in his penis  He also notes seeing frequent flashes and floaters in his vision.  At the time, he reports he was hospitalized and had MRI of the brain which was reportedly unremarkable.  He had seen multiple specialists.  He was told by GI that he had GERD or IBS.  He was diagnosed with phimosis and was noted to have an area of inflammation near the penile tip.  STD testing was unremarkable.  He saw a neurologist at that time and suggested fibromyalgia or psychosomatic etiology.  He had multiple labs performed, including ANA, RF, ESR, CRP, ds-DNA antibodies, anti smooth muscle antibody, mitochondrial antibody, SSA/SSB antibodies, anti-myelin antibody, MPO, c-ANCA, p-ANCA,  HLA-B27, ACE, B12, B1, TSH, Lyme, RPR, parvovirus antibodies, hepatitis panel, Symptoms persisted.  He had seen ophthalmology over the summer.  Eye exam was unremarkable and he was diagnosed with allergies.  He also endorsed left-sided  pulsatile tinnitus for the past 10 years, occurring when lying down in any position.  He Endoscopy revealed no intranasal masses or anything to contribute to atypical facial pain.  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  Earlier this month, he complained of hearing loss and aural fullness in the left ear and was found to have otitis media with effusion.   He still has a dull right frontal headache and top of head, constant, and without associated symptoms.  If he coughs, it may become more intense.    Labs from September, including CBC, CMP, HIV, RPR, were unremarkable.   PAST MEDICAL HISTORY: Past Medical History:  Diagnosis Date  . Hyperlipidemia    no rx  . Kyphoscoliosis    diagnosed as teengager- wore brace for some time- didnt seem to hepl     PAST SURGICAL HISTORY: Past Surgical History:  Procedure Laterality Date  . none      MEDICATIONS: Current Outpatient Medications on File Prior to Visit  Medication Sig Dispense Refill  . Aspirin-Acetaminophen-Caffeine (EXCEDRIN EXTRA STRENGTH PO) Take by mouth.     No current facility-administered medications on file prior to visit.     ALLERGIES: No Known Allergies  FAMILY HISTORY: Family History  Problem Relation Age of Onset  . Heart attack Father        61, smoker  . Hypertension Father   . Hyperlipidemia Father   . Lung cancer Maternal Grandfather        smoker  . Healthy Mother   . Healthy Sister        nosebleeds, polychromasia in blood- seeing hematologist  . Arthritis Maternal Grandmother   .  Other Paternal Grandmother        unknown history  . Other Paternal Grandfather        unknown history- possible heart issues    SOCIAL HISTORY: Social History   Socioeconomic History  . Marital status: Married    Spouse name: Not on file  . Number of children: Not on file  . Years of education: Not on file  . Highest education level: Not on file  Occupational History  . Not on file  Social Needs  . Financial resource  strain: Not on file  . Food insecurity:    Worry: Not on file    Inability: Not on file  . Transportation needs:    Medical: Not on file    Non-medical: Not on file  Tobacco Use  . Smoking status: Current Every Day Smoker    Packs/day: 1.00    Start date: 11/02/2002  . Smokeless tobacco: Never Used  Substance and Sexual Activity  . Alcohol use: Not Currently    Comment:  0 per week, rare drinker in past  . Drug use: Not Currently    Comment: marijuana a steen  . Sexual activity: Yes    Partners: Female  Lifestyle  . Physical activity:    Days per week: Not on file    Minutes per session: Not on file  . Stress: Not on file  Relationships  . Social connections:    Talks on phone: Not on file    Gets together: Not on file    Attends religious service: Not on file    Active member of club or organization: Not on file    Attends meetings of clubs or organizations: Not on file    Relationship status: Not on file  . Intimate partner violence:    Fear of current or ex partner: Not on file    Emotionally abused: Not on file    Physically abused: Not on file    Forced sexual activity: Not on file  Other Topics Concern  . Not on file  Social History Narrative   Married. 1 daughter 3.5 years in 2019.       IT consultant      Hobbies: time with daughter    REVIEW OF SYSTEMS: Constitutional: No fevers, chills, or sweats, no generalized fatigue, change in appetite Eyes: No visual changes, double vision, eye pain Ear, nose and throat: No hearing loss, ear pain, nasal congestion, sore throat Cardiovascular: No chest pain, palpitations Respiratory:  No shortness of breath at rest or with exertion, wheezes GastrointestinaI: No nausea, vomiting, diarrhea, abdominal pain, fecal incontinence Genitourinary:  No dysuria, urinary retention or frequency Musculoskeletal:  No neck pain, back pain Integumentary: No rash, pruritus, skin lesions Neurological: as above Psychiatric: No  depression, insomnia, anxiety Endocrine: No palpitations, fatigue, diaphoresis, mood swings, change in appetite, change in weight, increased thirst Hematologic/Lymphatic:  No purpura, petechiae. Allergic/Immunologic: no itchy/runny eyes, nasal congestion, recent allergic reactions, rashes  PHYSICAL EXAM: Blood pressure 130/80, pulse 77, height '5\' 9"'  (1.753 m), weight 262 lb 2 oz (118.9 kg), SpO2 97 %. General: No acute distress.  Patient appears well-groomed.  Head:  Normocephalic/atraumatic Eyes:  fundi examined but not visualized Neck: supple, no paraspinal tenderness, full range of motion Back: No paraspinal tenderness Heart: regular rate and rhythm Lungs: Clear to auscultation bilaterally. Vascular: No carotid bruits. Neurological Exam: Mental status: alert and oriented to person, place, and time, recent and remote memory intact, fund of knowledge intact, attention and concentration  intact, speech fluent and not dysarthric, language intact. Cranial nerves: CN I: not tested CN II: pupils equal, round and reactive to light, visual fields intact CN III, IV, VI:  full range of motion, no nystagmus, no ptosis CN V: facial sensation intact CN VII: upper and lower face symmetric CN VIII: hearing intact CN IX, X: gag intact, uvula midline CN XI: sternocleidomastoid and trapezius muscles intact CN XII: tongue midline Bulk & Tone: normal, no fasciculations. Motor:  5/5 throughout  Sensation: temperature and vibration sensation intact. Deep Tendon Reflexes:  2+ throughout, toes downgoing.  Finger to nose testing:  Without dysmetria.  Heel to shin:  Without dysmetria.  Gait:  Normal station and stride.  Able to turn and tandem walk. Romberg negative.  IMPRESSION: 1.  Pineal gland cyst.  MRI does not show evidence of mass effect of hydrocephalus.  He does not exhibit clinical signs of Parinaud syndrome on exam.   2.  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  I don't think  it is related to the pineal gland cyst 3.  Left sided pulsatile tinnitus  PLAN: 1.  Regarding pineal gland cyst, would repeat MRI of brain (with attention to pineal gland) with and without contrast in one year and follow up afterwards. 2.  Regarding pulsatile tinnitus, will check TSH.  If unremarkable, would check CTA of head and neck. 3.  Offered to start a daily headache preventative such as nortriptyline.  He defers at this time.  Advised to limit use of pain relievers to no more than 2 days out of week to prevent risk of rebound or medication-overuse headache. 4.  Tobacco cessation counseling (CPT 99406):  Tobacco use with no history of CAD, stroke, or cancer  - Currently smoking 1 packs/day   - Patient was informed of the dangers of tobacco abuse including stroke, cancer, and MI, as well as benefits of tobacco cessation. - Patient is not willing to quit at this time. - Approximately 4 mins were spent counseling patient cessation techniques. We discussed various methods to help quit smoking, including deciding on a date to quit, joining a support group, pharmacological agents- nicotine gum/patch/lozenges, chantix.  - I will reassess his progress at the next follow-up visit  Thank you for allowing me to take part in the care of this patient.  Metta Clines, DO  CC: Garret Reddish, MD

## 2018-10-24 ENCOUNTER — Other Ambulatory Visit (INDEPENDENT_AMBULATORY_CARE_PROVIDER_SITE_OTHER): Payer: 59

## 2018-10-24 ENCOUNTER — Encounter: Payer: Self-pay | Admitting: Neurology

## 2018-10-24 ENCOUNTER — Other Ambulatory Visit: Payer: 59

## 2018-10-24 ENCOUNTER — Ambulatory Visit (INDEPENDENT_AMBULATORY_CARE_PROVIDER_SITE_OTHER): Payer: 59 | Admitting: Neurology

## 2018-10-24 VITALS — BP 130/80 | HR 77 | Ht 69.0 in | Wt 262.1 lb

## 2018-10-24 DIAGNOSIS — G44229 Chronic tension-type headache, not intractable: Secondary | ICD-10-CM

## 2018-10-24 DIAGNOSIS — E348 Other specified endocrine disorders: Secondary | ICD-10-CM

## 2018-10-24 DIAGNOSIS — H93A2 Pulsatile tinnitus, left ear: Secondary | ICD-10-CM

## 2018-10-24 DIAGNOSIS — F17200 Nicotine dependence, unspecified, uncomplicated: Secondary | ICD-10-CM | POA: Diagnosis not present

## 2018-10-24 NOTE — Patient Instructions (Addendum)
1.  Recommend that we recheck MRI of brain with and without contrast in one year.  Follow up afterwards.  We have sent a referral to [facility] for your MRI and they will call you directly to schedule your appt. They are located at [place]. If you need to contact them directly please call [phone].   2.  To evaluate the whooshing sound in your ear, we will check thyroid test.  If unremarkable, we will check imaging of the blood vessels in head and neck.  Your provider requests that you have LABS drawn today.  We share a lab with [facility] - they are located in [address] (second floor) of this building.  Once you get there, please have a seat and the phlebotomist will call your name.  If you have waited more than 15 minutes, please advise the front desk  3.  Try to work on quitting smoking.

## 2018-10-25 LAB — TSH: TSH: 2.03 m[IU]/L (ref 0.40–4.50)

## 2018-11-01 ENCOUNTER — Other Ambulatory Visit: Payer: Self-pay

## 2018-11-01 DIAGNOSIS — G44229 Chronic tension-type headache, not intractable: Secondary | ICD-10-CM

## 2018-11-01 DIAGNOSIS — H93A2 Pulsatile tinnitus, left ear: Secondary | ICD-10-CM

## 2018-11-01 DIAGNOSIS — E348 Other specified endocrine disorders: Secondary | ICD-10-CM

## 2018-11-13 ENCOUNTER — Other Ambulatory Visit: Payer: 59

## 2019-02-13 ENCOUNTER — Ambulatory Visit (INDEPENDENT_AMBULATORY_CARE_PROVIDER_SITE_OTHER): Payer: 59 | Admitting: Family Medicine

## 2019-02-13 ENCOUNTER — Ambulatory Visit: Payer: Self-pay

## 2019-02-13 ENCOUNTER — Encounter: Payer: Self-pay | Admitting: Family Medicine

## 2019-02-13 VITALS — Temp 98.1°F | Ht 69.0 in | Wt 262.0 lb

## 2019-02-13 DIAGNOSIS — F17200 Nicotine dependence, unspecified, uncomplicated: Secondary | ICD-10-CM

## 2019-02-13 DIAGNOSIS — R6889 Other general symptoms and signs: Secondary | ICD-10-CM

## 2019-02-13 DIAGNOSIS — R0602 Shortness of breath: Secondary | ICD-10-CM | POA: Diagnosis not present

## 2019-02-13 DIAGNOSIS — Z20822 Contact with and (suspected) exposure to covid-19: Secondary | ICD-10-CM

## 2019-02-13 DIAGNOSIS — R058 Other specified cough: Secondary | ICD-10-CM

## 2019-02-13 DIAGNOSIS — R509 Fever, unspecified: Secondary | ICD-10-CM | POA: Diagnosis not present

## 2019-02-13 DIAGNOSIS — R05 Cough: Secondary | ICD-10-CM

## 2019-02-13 NOTE — Telephone Encounter (Signed)
See note. Patient has appointment at 1:20

## 2019-02-13 NOTE — Telephone Encounter (Signed)
Pt called to say since lasrThursday he has felt SOB. He has had night sweats but no fever. He states he has headaches that he treats with Excedrin. His highest temp 99.4. He reports night sweats. No wheezing no other symptoms. Per protocol call was transferred to [facility]. E mail verified.  Reason for Disposition . [1] MILD difficulty breathing (e.g., minimal/no SOB at rest, SOB with walking, pulse <100) AND [2] NEW-onset or WORSE than normal  Answer Assessment - Initial Assessment Questions 1. RESPIRATORY STATUS: "Describe your breathing?" (e.g., wheezing, shortness of breath, unable to speak, severe coughing)      SOB 2. ONSET: "When did this breathing problem begin?"      Thursday night 3. PATTERN "Does the difficult breathing come and go, or has it been constant since it started?"      Yes went away Saturday 4. SEVERITY: "How bad is your breathing?" (e.g., mild, moderate, severe)    - MILD: No SOB at rest, mild SOB with walking, speaks normally in sentences, can lay down, no retractions, pulse < 100.    - MODERATE: SOB at rest, SOB with minimal exertion and prefers to sit, cannot lie down flat, speaks in phrases, mild retractions, audible wheezing, pulse 100-120.    - SEVERE: Very SOB at rest, speaks in single words, struggling to breathe, sitting hunched forward, retractions, pulse > 120      modderate 5. RECURRENT SYMPTOM: "Have you had difficulty breathing before?" If so, ask: "When was the last time?" and "What happened that time?"      No 6. CARDIAC HISTORY: "Do you have any history of heart disease?" (e.g., heart attack, angina, bypass surgery, angioplasty)      no 7. LUNG HISTORY: "Do you have any history of lung disease?"  (e.g., pulmonary embolus, asthma, emphysema)     Smoker Asthma as child 8. CAUSE: "What do you think is causing the breathing problem?"      Nothing  9. OTHER SYMPTOMS: "Do you have any other symptoms? (e.g., dizziness, runny nose, cough, chest pain, fever)  Low fever 99.4 occasional cough, 10. PREGNANCY: "Is there any chance you are pregnant?" "When was your last menstrual period?"       No 11. TRAVEL: "Have you traveled out of the country in the last month?" (e.g., travel history, exposures)       No  Protocols used: BREATHING DIFFICULTY-A-AH

## 2019-02-13 NOTE — Progress Notes (Addendum)
Phone [phone]   Subjective:  Virtual visit via Video note. Chief complaint: Chief Complaint  Patient presents with  . Shortness of Breath    Started 4 days ago  . Fever    Pt not sure if hes had fever temp was 99.4 pt stated that this is his normal sometimes   This visit type was conducted due to national recommendations for restrictions regarding the COVID-19 Pandemic (e.g. social distancing).  This format is felt to be most appropriate for this patient at this time balancing risks to patient and risks to population by having him in for in person visit.  No physical exam was performed (except for noted visual exam or audio findings with Telehealth visits).    Our team/I connected with Runell Gess on 02/13/19 at  1:20 PM EDT by a video enabled telemedicine application ([email or web address]) and verified that I am speaking with the correct person using two identifiers.  Location patient: [facility] Location provider: [facility], office Persons participating in the virtual visit:  patient  Our team/I discussed the limitations of evaluation and management by telemedicine and the availability of in person appointments. In light of current covid-19 pandemic, patient also understands that we are trying to protect them by minimizing in office contact if at all possible.  The patient expressed consent for telemedicine visit and agreed to proceed. Patient understands insurance will be billed.   ROS- no chest pain. Feels winded. Has had subjective fevers. Mild dry cough.    Past Medical History-  Patient Active Problem List   Diagnosis Date Noted  . Hyperlipidemia     Priority: Medium  . Kyphoscoliosis     Priority: Low  . Smoker 06/29/2018    Medications- reviewed and updated Current Outpatient Medications  Medication Sig Dispense Refill  . Aspirin-Acetaminophen-Caffeine (EXCEDRIN EXTRA STRENGTH PO) Take by mouth.     No current facility-administered medications for this visit.       Objective:  Temp 98.1 F (36.7 C) (Oral)   Ht 5\' 9"  (1.753 m)   Wt 262 lb (118.8 kg)   BMI 38.69 kg/m  Gen: NAD, resting comfortably Lungs: nonlabored, normal respiratory rate  Patient able to jog in place during visit without difficulty Also walks around house and no apparent respiratory distress Skin: appears dry, no obvious rash    Assessment and Plan   # shortness of breath/cough/subjective fevers S:symptoms started last week where had subjective fever and headache. He was getting some chills and nightsweats (drenching pillow). He started with excedrin (gets headaches sometimes). Took it for 3-4 days and Thursday night started felt like he was breathing harder. Friday felt same way- states was not really short of breath though. Saturday symptoms seemed to go away for a little bit. Today and yesterday symptoms seemed to worsen. Temperature maxed out at 99.4 or 99.6. some mild cough but states mainly for throat clearing.   Denies chest pain but occasionally gets a mild tingling in his chest.   He went out with his daughter and tried to get her to ride the bike around and he struggled to breath.    Still smoking 1 pack per day.   Able to work from home.   Wife and daughter are not showing any symptoms. He has no known contact with covid 19.  A/P: patient with mild shortness of breath, subjective fever, headache. Patient is concerned  This could be related to his prior concerns about potential lymphoma- fortunately we have not found any firm evidence  of any lymphoma. Due to covid 19 and inadequate PPE, cannot advise chest x-ray here or imaging of other sorts at [facility].   Unfortunately, we cannot exclude covid-19 as potential cause of symptoms. Therefore: - recommended patient watch closely for worsening shortness of breath (he is currently able to jog in place in his house without significant difficulty and appears comfortable at rest as well)  or confusion or worsening  symptoms and if those occur he should contact us immediately- also discussed possibly going to hospital or calling 911 depending on severity of symptoms -recommended self isolation for 7 days PLUS symptom free for at least 3 days  - work note not needed as works from home.  - will plan to check on patient in 1 day -strongly encouraged smoking cessation- he is not ready to quit  Lab/Order associations: Shortness of breath  Fever, unspecified fever cause  Dry cough  Smoker  Suspected Covid-19 Virus Infection  Return precautions advised.  Garret Reddish, MD  02/20/19-patient did not respond to my chart message on the 15th.  Called to check on patient but he did not pick up.  We will plan on calling back later this week.  Garret Reddish

## 2019-02-13 NOTE — Telephone Encounter (Signed)
Noted  

## 2019-02-13 NOTE — Patient Instructions (Addendum)
Video visit

## 2019-02-14 ENCOUNTER — Encounter: Payer: Self-pay | Admitting: Family Medicine

## 2019-02-15 NOTE — Telephone Encounter (Signed)
Please see message . Thank you .

## 2019-02-24 ENCOUNTER — Encounter: Payer: Self-pay | Admitting: Family Medicine

## 2019-04-03 ENCOUNTER — Ambulatory Visit: Payer: Self-pay

## 2019-04-03 NOTE — Telephone Encounter (Signed)
Pt removed a deer tick on the shaft of his penis to the mid penis area. Pt stated the head was removed and there is slight redness at the spot where the tick was removed. Pt denies itching, fever, rash, joint pain or looking infected. Pt stated that the tick was easy to remove.  Care advice given and pt verbalized understanding.  Reason for Disposition . Tick bite with no complications  Answer Assessment - Initial Assessment Questions 1. TYPE of TICK: "Is it a wood tick or a deer tick?" If unsure, ask: "What size was the tick?" "Did it look more like a watermelon seed or a poppy seed?"      Deer tick-like a poppy seed  2. LOCATION: "Where is the tick bite located?"      Shaft mid penis 3. ONSET: "How long do you think the tick was attached before you removed it?" (Hours or days)      Unknown- maybe 1 day 4. TETANUS: "When was the last tetanus booster?"      *No Answer* 5. PREGNANCY: "Is there any chance you are pregnant?" "When was your last menstrual period?"     n/a  Protocols used: TICK BITE-A-AH

## 2019-08-01 ENCOUNTER — Other Ambulatory Visit: Payer: Self-pay

## 2019-08-01 ENCOUNTER — Ambulatory Visit
Admission: RE | Admit: 2019-08-01 | Discharge: 2019-08-01 | Disposition: A | Discharge status: Home / Self Care | Payer: 59 | Source: Ambulatory Visit | Attending: Neurology | Admitting: Neurology

## 2019-08-01 DIAGNOSIS — H93A2 Pulsatile tinnitus, left ear: Secondary | ICD-10-CM

## 2019-08-01 DIAGNOSIS — G44229 Chronic tension-type headache, not intractable: Secondary | ICD-10-CM

## 2019-08-01 DIAGNOSIS — E348 Other specified endocrine disorders: Secondary | ICD-10-CM

## 2019-08-01 MED ORDER — GADOBENATE DIMEGLUMINE 529 MG/ML IV SOLN
20.0000 mL | Freq: Once | INTRAVENOUS | Status: AC | PRN
  Administered 2019-08-01: 15:00:00 20 mL via INTRAVENOUS

## 2019-08-03 ENCOUNTER — Telehealth: Payer: Self-pay | Admitting: *Deleted

## 2019-08-03 DIAGNOSIS — H93A2 Pulsatile tinnitus, left ear: Secondary | ICD-10-CM

## 2019-08-03 DIAGNOSIS — E348 Other specified endocrine disorders: Secondary | ICD-10-CM

## 2019-08-03 DIAGNOSIS — G44229 Chronic tension-type headache, not intractable: Secondary | ICD-10-CM

## 2019-08-03 NOTE — Telephone Encounter (Addendum)
Called patient and relayed all information below from Dr. Tomi Likens below regarding MRI/CTA.  Patient is still having pulsating/whooshing sound in his left ear a few times a week. Informed him will enter the order for CTA of head and neck and that they will call him to schedule.   Asked patient after they call and schedule to please call our office to schedule a follow up appt the week after test to discuss results with MD. Verbalized understanding of all of the above and stated he will call us for follow up.  Order placed for CTA head/neck for now and then MRI brain for one year.     ----- Message from Pieter Partridge, DO sent at 08/02/2019  7:22 AM EDT ----- Repeat MRI shows that the cyst seen on MRI last year is stable.  However, I would like to repeat MRI of brain with and without contrast in one year to ensure stability.  Also, does he still hear the pulsating or whooshing sound in his left ear?  Last year, I ordered a CTA of head and neck to evaluate for pulsatile tinnitus but it has not been performed.  If he is still experiencing this, then I would like him to have this performed and follow up with me afterwards.

## 2019-08-10 ENCOUNTER — Ambulatory Visit
Admission: RE | Admit: 2019-08-10 | Discharge: 2019-08-10 | Disposition: A | Discharge status: Home / Self Care | Payer: 59 | Source: Ambulatory Visit | Attending: Neurology | Admitting: Neurology

## 2019-08-10 ENCOUNTER — Other Ambulatory Visit: Payer: Self-pay

## 2019-08-10 DIAGNOSIS — G44229 Chronic tension-type headache, not intractable: Secondary | ICD-10-CM

## 2019-08-10 DIAGNOSIS — H93A2 Pulsatile tinnitus, left ear: Secondary | ICD-10-CM

## 2019-08-10 DIAGNOSIS — E348 Other specified endocrine disorders: Secondary | ICD-10-CM

## 2019-08-10 IMAGING — CT CT ANGIO NECK
1 series · 13 of 16 positions shown · IV contrast (APPLIED)
Comparison: Brain MRI 08/01/2019

CLINICAL DATA: Pulsatile tinnitus on the left. Severe daily
headaches for 1.5 months.

EXAM:
CT ANGIOGRAPHY HEAD AND NECK
TECHNIQUE: Multidetector CT imaging of the head and neck was performed using
the standard protocol during bolus administration of intravenous
contrast. Multiplanar CT image reconstructions and MIPs were
obtained to evaluate the vascular anatomy. Carotid stenosis
measurements (when applicable) are obtained utilizing NASCET
criteria, using the distal internal carotid diameter as the
denominator.
CONTRAST:  75mL HYMQYR-LB9 IOPAMIDOL (HYMQYR-LB9) INJECTION 76%

[Series 13: axial mip thins · axial · 0.35mm/px · z∈[-348,-31]mm · 13 of 372 slices shown]
[im 25/372  soft-tissue]
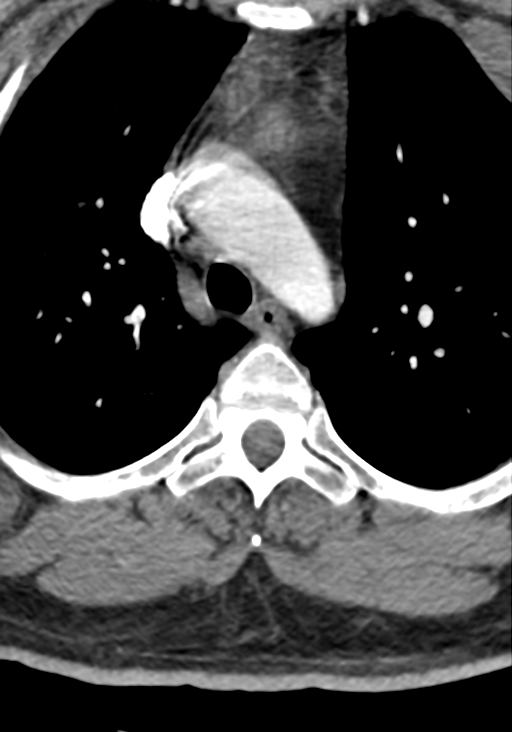
[im 50/372  bone]
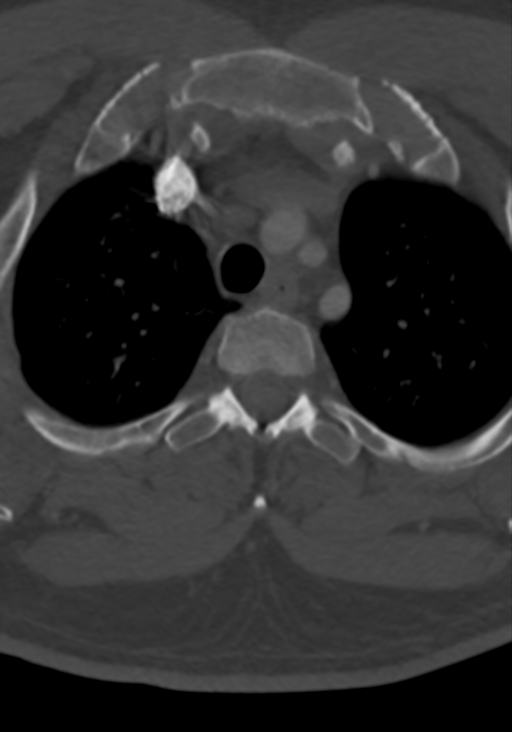
[im 75/372  soft-tissue]
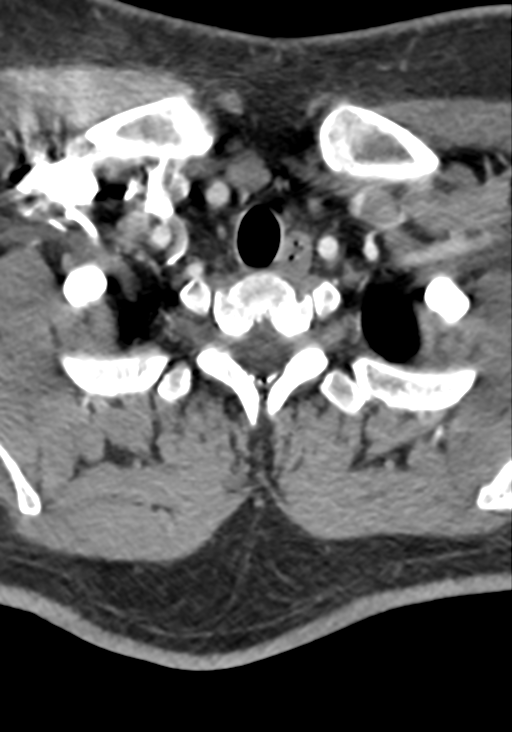
[im 99/372  bone]
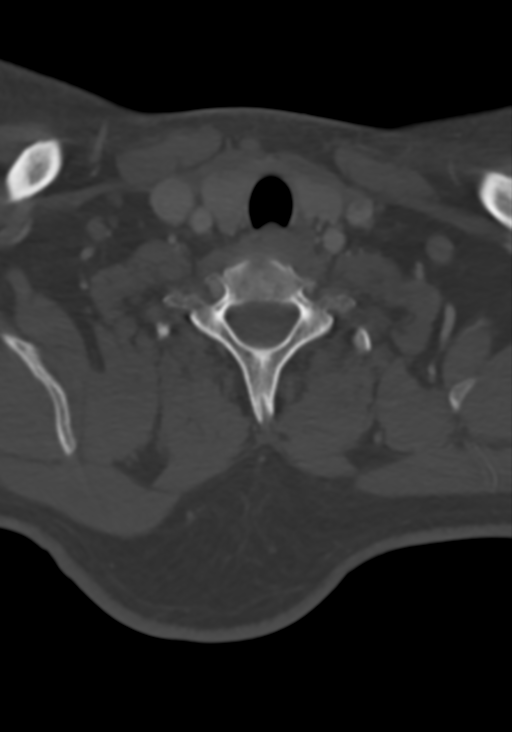
[im 124/372  soft-tissue]
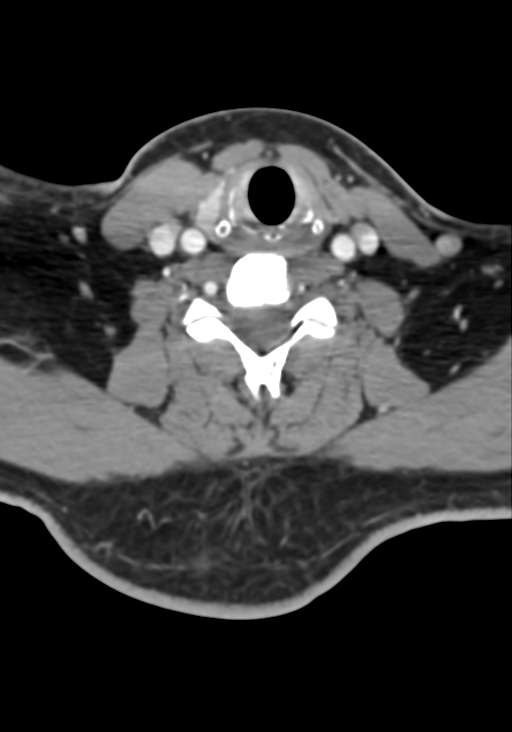
[im 149/372  bone]
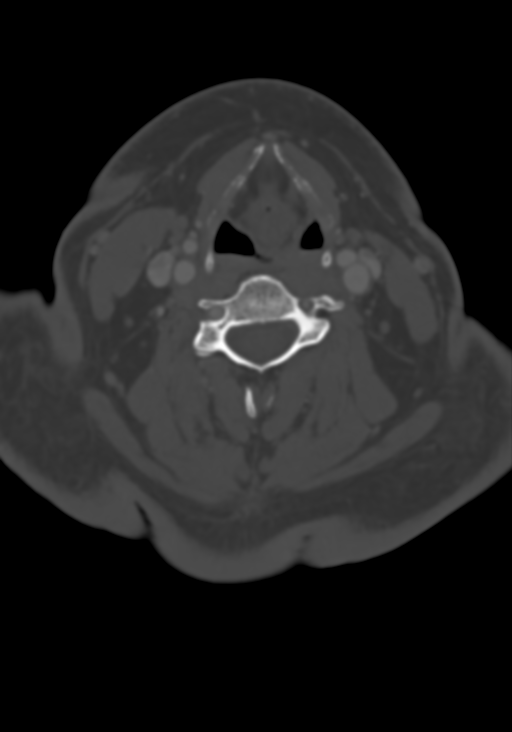
[im 198/372  soft-tissue]
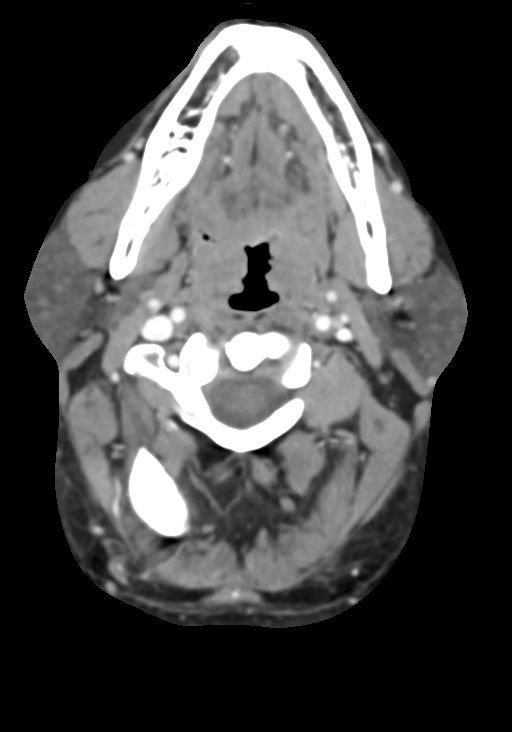
[im 223/372  bone]
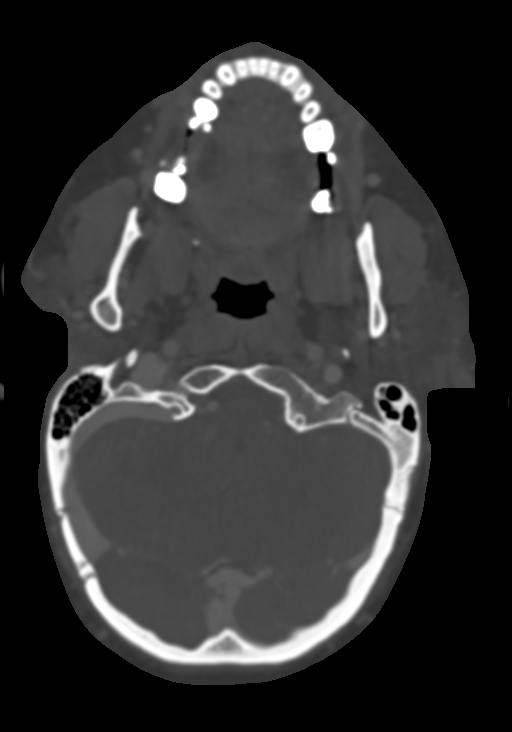
[im 248/372  soft-tissue]
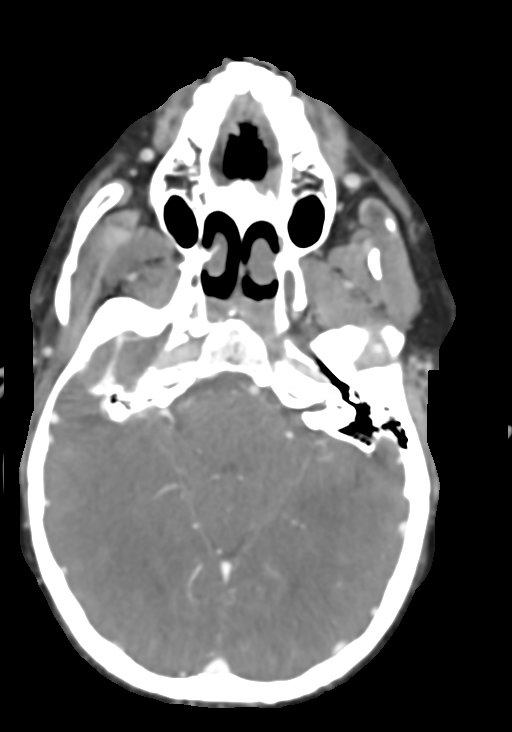
[im 273/372  bone]
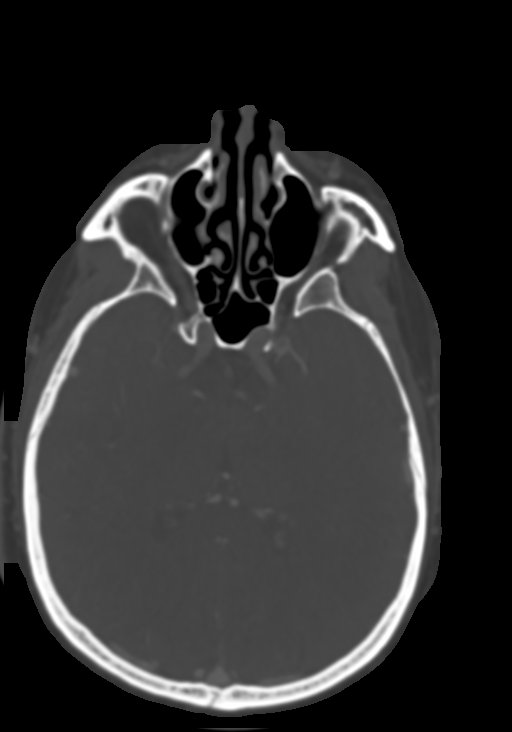
[im 297/372  soft-tissue]
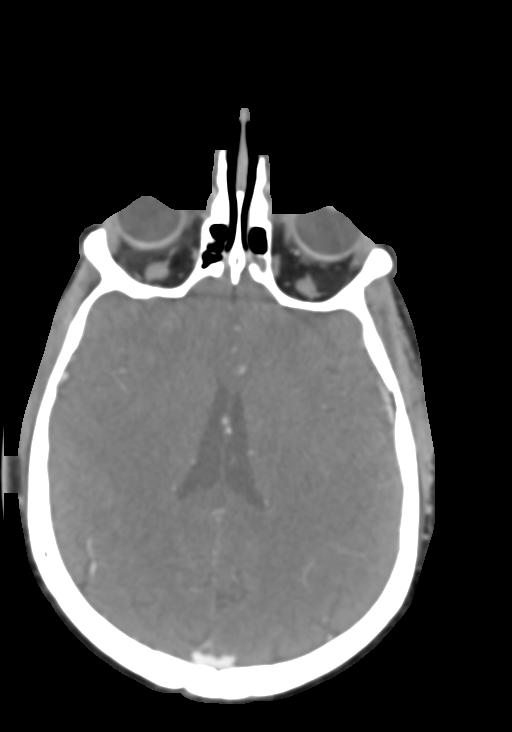
[im 322/372  bone]
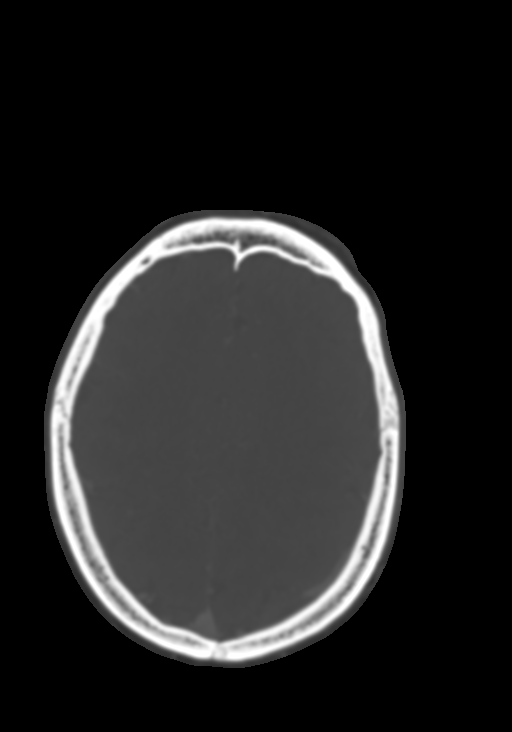
[im 347/372  soft-tissue]
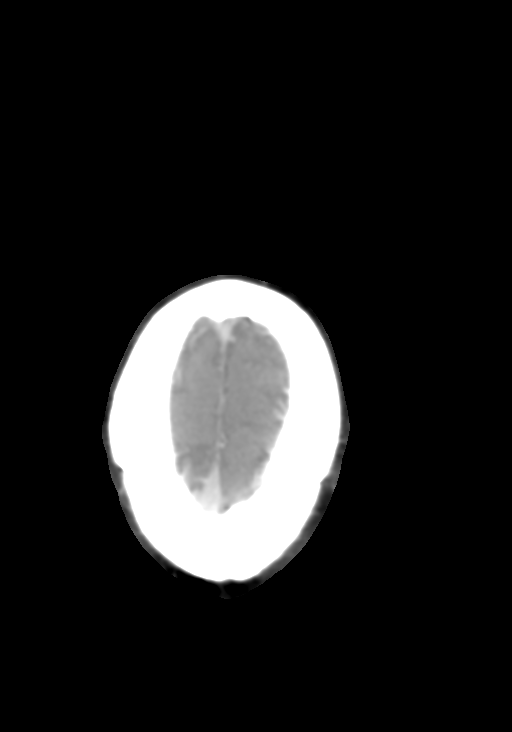

[13 of 16 positions shown; findings below may reference images not displayed]

FINDINGS: Suboptimal, delayed image acquisition but still diagnostic for the
indication.

CT HEAD FINDINGS

Brain: No evidence of acute infarction, hemorrhage, hydrocephalus,
extra-axial collection or mass lesion/mass effect.

Vascular: No hyperdense vessel or unexpected calcification.

Skull: Normal. Negative for fracture or focal lesion.

Sinuses: Imaged portions are clear.

Orbits: Negative

Review of the MIP images confirms the above findings

CTA NECK FINDINGS

Aortic arch: 2 vessel arch branching.  No pathologic finding.

Right carotid system: Vessels are smooth and widely patent.

Left carotid system: Vessels are smooth and widely patent.

Vertebral arteries: No proximal subclavian stenosis. Strong right
vertebral artery dominance. Both vertebral arteries are smooth and
widely patent to the dura.

Skeleton: Negative

Other neck: No evidence of mass or swelling

Upper chest: Negative

Review of the MIP images confirms the above findings

CTA HEAD FINDINGS

Anterior circulation: Vessels are smooth and widely patent. No
evidence of aneurysm.

Posterior circulation: No evidence of stenosis, beading, or
aneurysm.

Venous sinuses: Diffusely patent. The left transverse sigmoid sinus
is non dominant and shows no stenosis or dehiscence.

Anatomic variants: Hypoplastic right A1 segment and fetal type right
PCA. Strong right vertebral artery dominance.

Review of the MIP images confirms the above findings
IMPRESSION: Negative exam.  No explanation for tinnitus/headache.

## 2019-08-10 MED ORDER — IOPAMIDOL (ISOVUE-370) INJECTION 76%
75.0000 mL | Freq: Once | INTRAVENOUS | Status: AC | PRN
  Administered 2019-08-10: 75 mL via INTRAVENOUS

## 2019-08-14 ENCOUNTER — Telehealth: Payer: Self-pay | Admitting: *Deleted

## 2019-08-14 NOTE — Telephone Encounter (Addendum)
Left message for patient (ok per DPR) regarding below results. Left our office number to call back if questions.   ----- Message from Pieter Partridge, DO sent at 08/11/2019 10:53 AM EDT -----     CT of arteries in head and neck show no abnormalities.

## 2019-10-01 IMAGING — MR MR ORBITS WO/W CM
13 of 16 series · 35 of 48 positions shown · IV contrast (multihance)
Comparison: None.

CLINICAL DATA: 32-year-old male with headache for 3 months. Visual
changes, "floaters" in both eyes right greater than left.
"Swooshing" sound in the ears for several months.

EXAM:
MRI HEAD AND ORBITS WITHOUT AND WITH CONTRAST
TECHNIQUE: Multiplanar, multiecho pulse sequences of the brain and surrounding
structures were obtained without and with intravenous contrast.
Multiplanar, multiecho pulse sequences of the orbits and surrounding
structures were obtained including fat saturation techniques, before
and after intravenous contrast administration.
CONTRAST:  20mL MULTIHANCE GADOBENATE DIMEGLUMINE 529 MG/ML IV SOLN

[Series 2: t1_se_sag · sagittal · 5.0mm · 0.45mm/px · 1 of 21 slices shown]
[im 1/21]
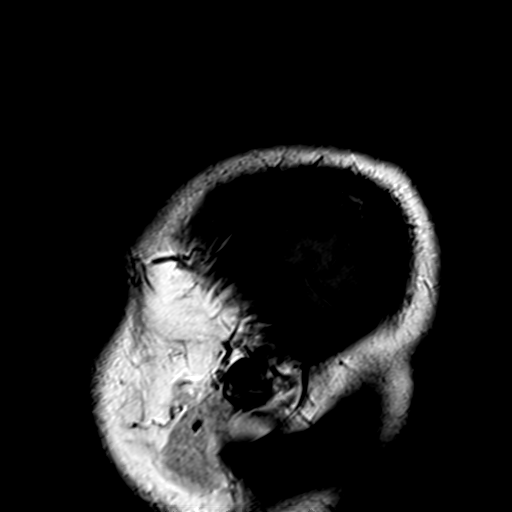

[Series 3: t2_tse_tra_512 · axial · 5.0mm · 0.60mm/px · z∈[-54,+88]mm · 2 of 24 slices shown]
[im 1/24]
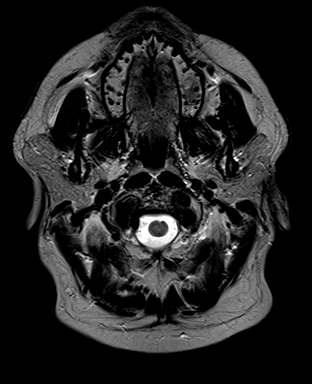
[im 24/24]
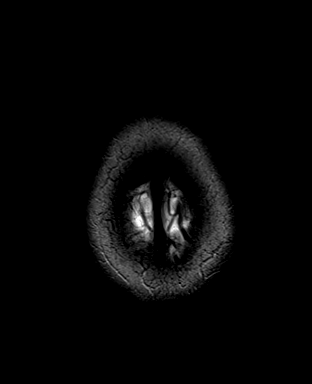

[Series 4: ep2d_diff_(id)_trace · axial · 3.0mm · 1.80mm/px · z∈[-53,+86]mm · 6 of 96 slices shown]
[im 1/96]
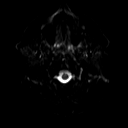
[im 20/96]
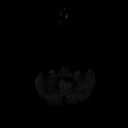
[im 39/96]
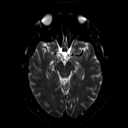
[im 58/96]
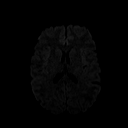
[im 77/96]
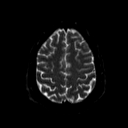
[im 96/96]
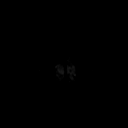

[Series 5: ep2d_diff_(id)_trace_adc · axial · 3.0mm · 1.80mm/px · z∈[-53,+86]mm · 3 of 48 slices shown]
[im 1/48]
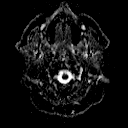
[im 24/48]
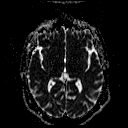
[im 48/48]
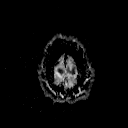

[Series 7: swi_images · axial · 4.0mm · 0.90mm/px · z∈[-60,+94]mm · 3 of 40 slices shown]
[im 1/40]
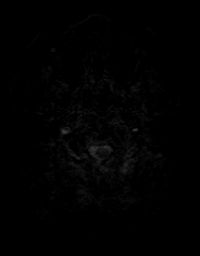
[im 20/40]
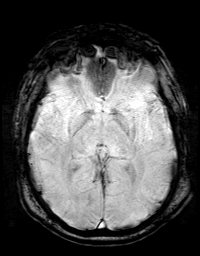
[im 40/40]
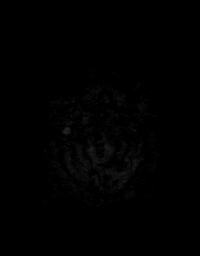

[Series 8: FLAIR · axial · 3.0mm · 0.43mm/px · z∈[-55,+87]mm · 2 of 30 slices shown]
[im 1/30]
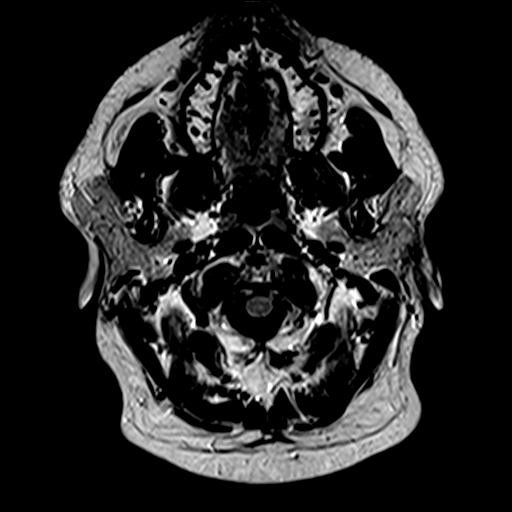
[im 30/30]
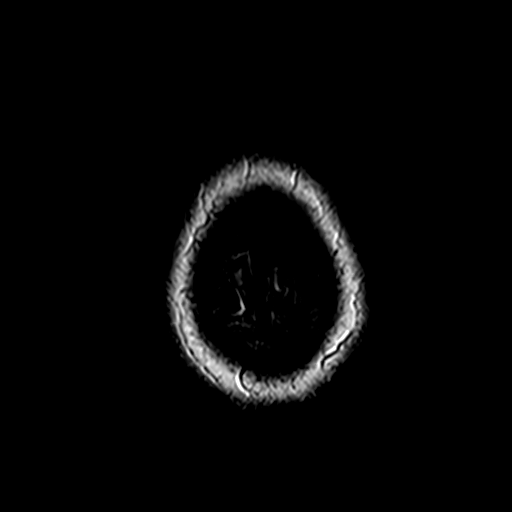

[Series 9: t1_mpr_tra · axial · 1.0mm · 0.72mm/px · z∈[-57,+91]mm · 8 of 144 slices shown]
[im 1/144]
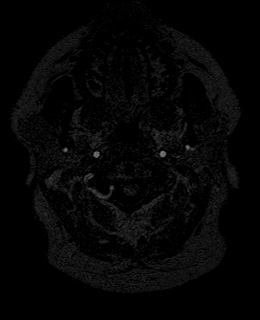
[im 18/144]
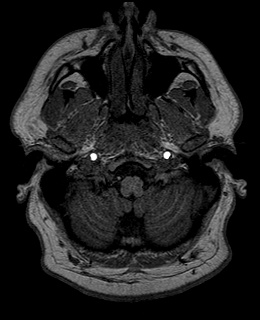
[im 36/144]
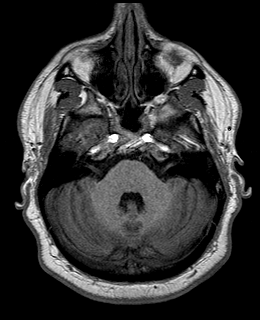
[im 54/144]
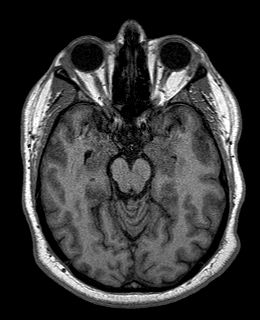
[im 90/144]
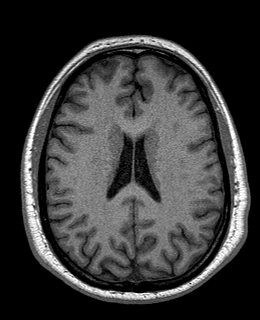
[im 108/144]
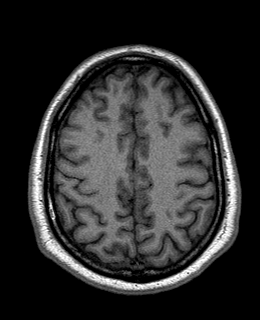
[im 126/144]
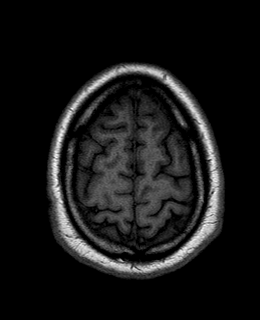
[im 144/144]
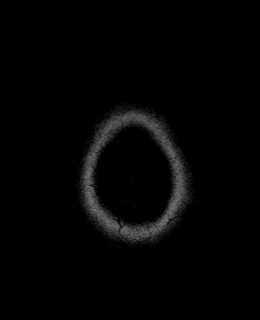

[Series 10: t1_tse cor_3mm · coronal · 3.0mm · 0.35mm/px · 2 of 30 slices shown]
[im 1/30]
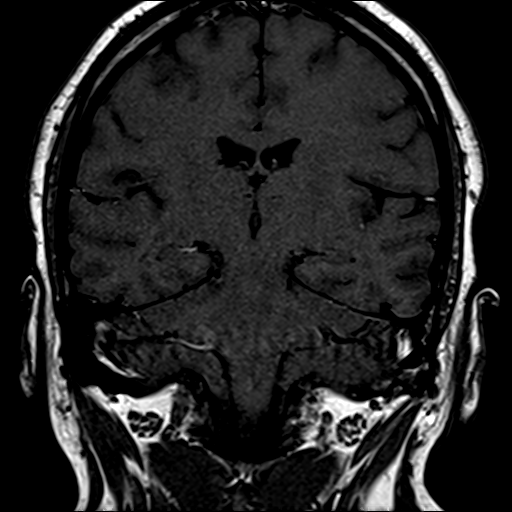
[im 30/30]
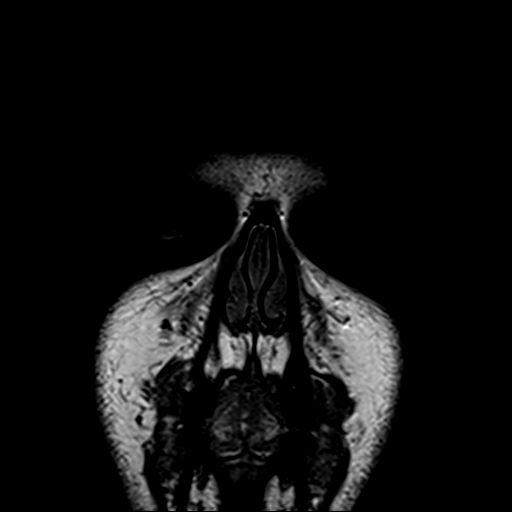

[Series 11: t2_cor_fs_3mm · coronal · 3.0mm · 0.35mm/px · 2 of 30 slices shown]
[im 1/30]
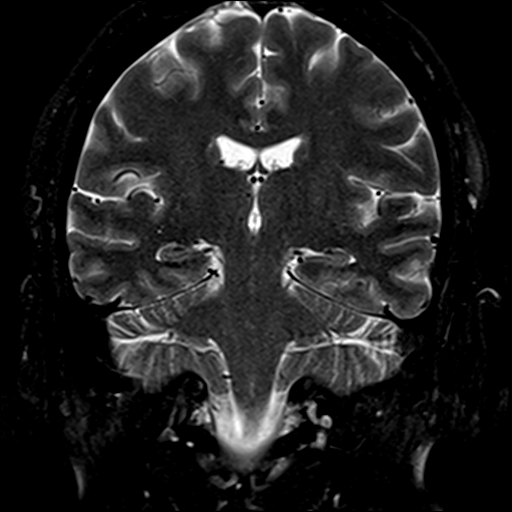
[im 30/30]
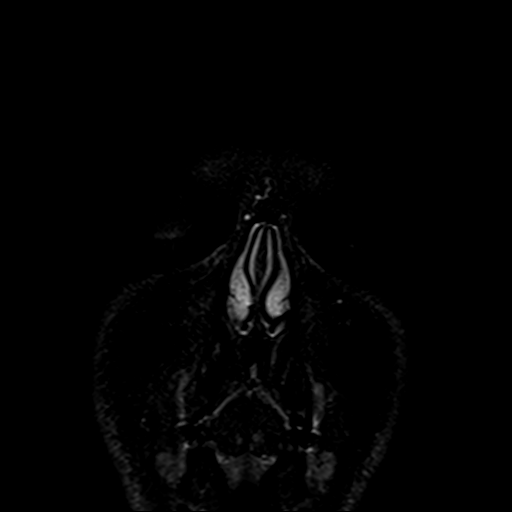

[Series 12: t1_tse axial_3mm · axial · 3.0mm · 0.35mm/px · 1 of 20 slices shown]
[im 1/20]
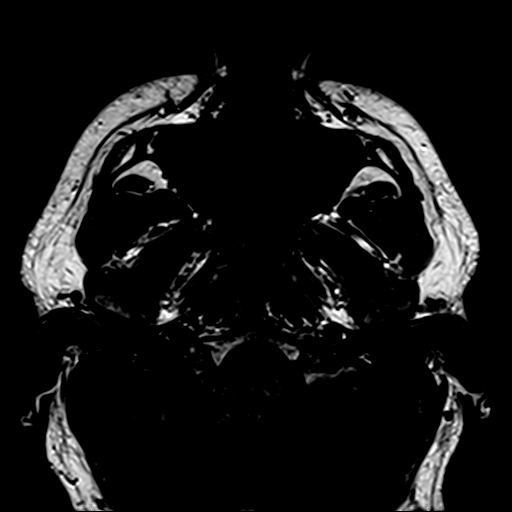

[Series 13: t2_ax_fs_3mm · axial · 3.0mm · 0.35mm/px · 1 of 20 slices shown]
[im 1/20]
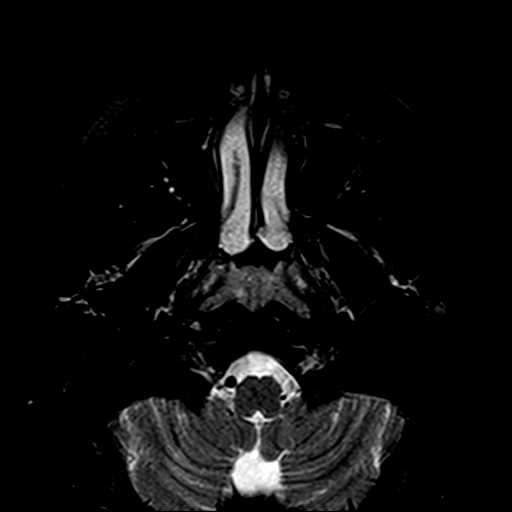

[Series 14: T2 · coronal · 5.0mm · 0.45mm/px · 2 of 26 slices shown]
[im 1/26]
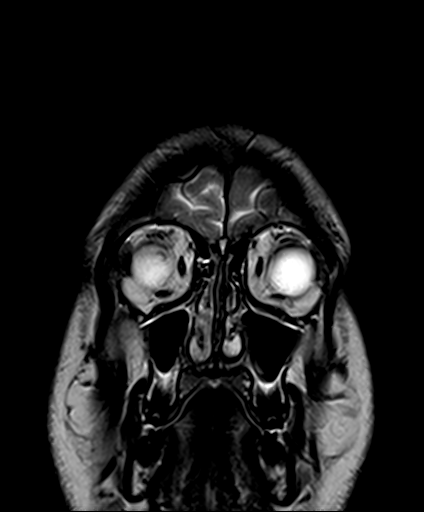
[im 26/26]
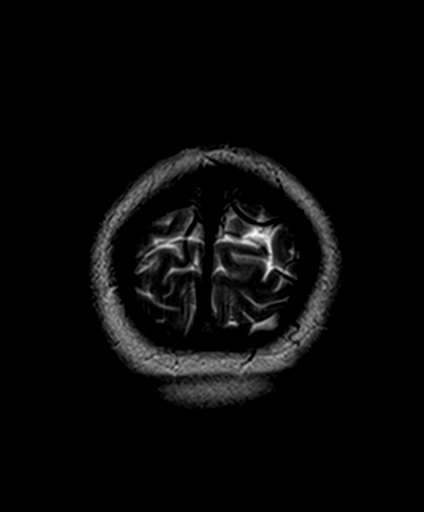

[Series 18: T1 post-contrast · coronal · 5.0mm · 0.45mm/px · 2 of 26 slices shown]
[im 1/26]
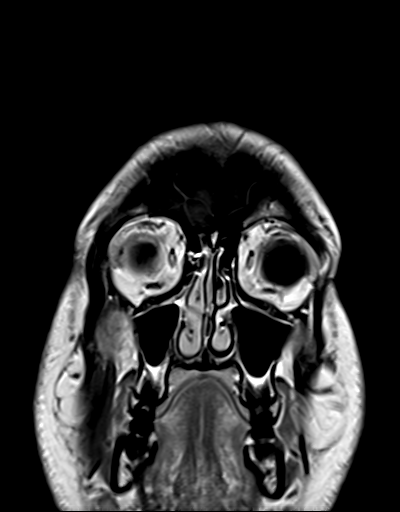
[im 26/26]
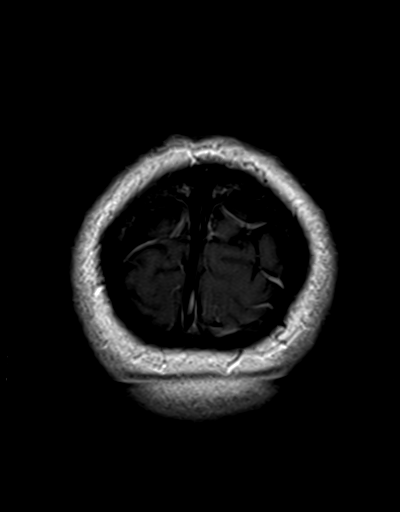

[35 of 48 positions shown; findings below may reference images not displayed]

FINDINGS: MRI HEAD FINDINGS

Brain: Cystic change to the pineal gland measuring up to 12
millimeters in size (series 2, image 11). Mildly heterogeneous
enhancement along the posterior margin (series 17, image 70). Still,
no regional mass effect, the superior colliculus plate appears to
remain normal.

Normal cerebral volume. No restricted diffusion to suggest acute
infarction. No midline shift, mass effect, ventriculomegaly,
extra-axial collection or acute intracranial hemorrhage.
Cervicomedullary junction and pituitary are within normal limits.

There is evidence of a chronic microhemorrhage in the posterior
medial temporal lobe on series 7, image 16, lateral to the
hippocampal gyrus. No associated T2 signal abnormality or
enhancement.

No other chronic cerebral blood products. Otherwise normal gray and
white matter signal throughout the brain. No dural thickening or
other abnormal intracranial enhancement.

Vascular: Major intracranial vascular flow voids are preserved. The
right vertebral artery appears dominant. The major dural venous
sinuses are enhancing and appear patent.

Skull and upper cervical spine: Negative visible cervical spine.
Visualized bone marrow signal is within normal limits.

Other: Mastoid air cells are clear. Visible internal auditory
structures appear normal. Normal stylomastoid foramina. Scalp soft
tissues appear negative.

MRI ORBITS FINDINGS

Orbits: Normal suprasellar cistern. Normal cavernous sinus.
Bilateral optic nerves appear symmetric and normal with no abnormal
enhancement. No intraorbital mass or inflammation. Bilateral
extraocular muscles and lacrimal glands appear symmetric and normal.
Both globes appear normal.

Visualized sinuses: Trace paranasal sinus mucosal thickening.

Soft tissues: Superficial periorbital soft tissues and the deep soft
tissue spaces of the face appear normal.
IMPRESSION: 1. Cystic change of the pineal gland measuring up to 12 mm with no
regional mass effect.
Pineal cysts are common and in general produce no symptoms. When
large they have sometimes been implicated in Parinaud's Syndrome.
Recommend a repeat Brain MRI without and with contrast in 3-6 months
to document stability of size and enhancement, beyond which - if
stable - imaging surveillance can likely be performed with
noncontrast MRI.
Two years of size stability would be reassuring for benignity.
2. A solitary small focus of chronic microhemorrhage in the right
temporal lobe is nonspecific.
3. Elsewhere the brain has a normal MRI appearance.
4. Normal MRI appearance of both orbits.

## 2020-03-15 ENCOUNTER — Encounter: Payer: Self-pay | Admitting: Family Medicine

## 2020-03-19 ENCOUNTER — Other Ambulatory Visit: Payer: Self-pay

## 2020-03-19 ENCOUNTER — Ambulatory Visit (INDEPENDENT_AMBULATORY_CARE_PROVIDER_SITE_OTHER): Payer: 59 | Admitting: Family Medicine

## 2020-03-19 ENCOUNTER — Encounter: Payer: Self-pay | Admitting: Family Medicine

## 2020-03-19 VITALS — BP 130/80 | HR 67 | Temp 98.1°F | Ht 69.0 in | Wt 267.8 lb

## 2020-03-19 DIAGNOSIS — L989 Disorder of the skin and subcutaneous tissue, unspecified: Secondary | ICD-10-CM

## 2020-03-19 DIAGNOSIS — H6122 Impacted cerumen, left ear: Secondary | ICD-10-CM | POA: Diagnosis not present

## 2020-03-19 DIAGNOSIS — H938X2 Other specified disorders of left ear: Secondary | ICD-10-CM

## 2020-03-19 NOTE — Patient Instructions (Addendum)
Health Maintenance Due  Topic Date Due  . COVID-19 Vaccine (1)-please consider getting the COVID-19 vaccination Never done  . TETANUS/TDAP-need tetanus shot when you are feeling better Never done   Please call [facility] ear nose and throat for follow-up visit  In the meantime I would like you to pick up Debrox over-the-counter and try this to try to get rid of the wax and see if that helps.  Also would recommend Flonase over-the-counter trial for at least 2 weeks to see if that helps  Alternatively you can try mineral oil Mineral oil for ear full of wax Purchase mineral oil from laxative aisle Lay down on your side with ear that is bothering you facing up Use 3-4 drops with a dropper and place in ear for 30 seconds Place cotton swab outside of ear Turn to other side and allow this to drain Repeat 3-4 x a day Return to see [place] if not improving within a few days  I did not see an obvious ear infection today but that does not mean there is not one present-we opted to monitor since you are pain level is not severe-if you have new or worsening symptoms please message [place] or call us back and we could consider a course of Augmentin  We will call you within two weeks about your referral to dermatology. If you do not hear within 3 weeks, give [place] a call.   As always would love for you to quit smoking-lets schedule a physical perhaps in the next 3 to 6 months-could consider Chantix at that time

## 2020-03-19 NOTE — Progress Notes (Addendum)
Phone [phone] In person visit   Subjective:   Brandon Moody is a 34 y.o. year old very pleasant male patient who presents for/with See problem oriented charting Chief Complaint  Patient presents with  . Ear Pain    Left side; Pt says that he feels like the right ear has started hurting. Starting 4 days ago.   This visit occurred during the SARS-CoV-2 public health emergency.  Safety protocols were in place, including screening questions prior to the visit, additional usage of staff PPE, and extensive cleaning of exam room while observing appropriate contact time as indicated for disinfecting solutions.   Past Medical History-  Patient Active Problem List   Diagnosis Date Noted  . Hyperlipidemia     Priority: Medium  . Kyphoscoliosis     Priority: Low  . Smoker 06/29/2018    Medications- reviewed and updated Current Outpatient Medications  Medication Sig Dispense Refill  . acetaminophen (TYLENOL) 500 MG tablet Take 500 mg by mouth every 6 (six) hours as needed.    . Aspirin-Acetaminophen-Caffeine (EXCEDRIN EXTRA STRENGTH PO) Take by mouth.     No current facility-administered medications for this visit.     Objective:  BP 130/80   Pulse 67   Temp 98.1 F (36.7 C) (Temporal)   Ht 5\' 9"  (1.753 m)   Wt 267 lb 12.8 oz (121.5 kg)   SpO2 98%   BMI 39.55 kg/m  Gen: NAD, resting comfortably Right ear canal is normal.  Right tympanic membrane is normal.  Left tympanic membrane obstructed by cerumen. Oropharynx largely normal-does have stable large normal-sized tonsils.  Also nasal turbinates with mild edema-more narrow on the right side than the left. Multiple papules on forehead on both side- some in linear fashion on right side of forehead    Assessment and Plan   #Left Ear Pain/fullness S: Symptoms started last Thursday or Friday morning with partially clogged left ear. Mild pain at first. Took tylenol extra strength which helped the pain some - came off this  yesterday and today but now right ear seems bothered- some feelings of clogging. No significant pain - more of a pressure feeling on left ear - at the moment right ear hurting worse about 1/10  Denies recent congestion or illness. Not vaccinated from covid- still considering. No nasal drainage. No sore throat.   Persistent issues with intermittent floaters and headaches- long term issue. Saw urology but was concerned about biopsy a lesion- still concerned about possible lymphoma but does not want to have surgery in such a sensitive area.  A/P: 34 year old male with several days of left ear fullness noted to have cerumen impaction.  Also has some mild right ear pain-no cerumen on exam.  Tympanic membrane is normal. -Since we could not directly visualize tympanic membrane attempted curetting but unsuccessful.  We have tried irrigation in the past without success and he preferred not to retrial this. -He is going to schedule with his ear nose and throat doctor from [facility] -He will try Debrox on the left ear in the meantime -If he has new or worsening symptoms we did discuss potentially starting an antibiotic on hold off as long as the symptoms are somewhat mild -May have underlying allergies with swollen nasal turbinates on exam-recommended a trial of Flonase over-the-counter  # smoker-SOB with respiratory illness last year- still with a few episodes but much better. Still smoking. Has not tried nicotine patches or gum. Dad quit after 40 years with chantix and tolerated well  other than some burning in his stomach. Encouraged cessation from smoking and to let us know if new or worsening issues with shortness of breath.   #skin lesions- has noted new lesions on forehead for about a year. May be seborrheic keratosis but some are linear and I would ike to get dermatology opinion.   Recommended follow up: Return for as needed for new, worsening, persistent symptoms.   Lab/Order associations:    ICD-10-CM   1. Ear fullness, left  H93.8X2 CANCELED: Ambulatory referral to ENT  2. Impacted cerumen of left ear  H61.22 CANCELED: Ambulatory referral to ENT  3. Skin lesion of face  L98.9 Ambulatory referral to Dermatology    Time Spent: 23 minutes of total time (9:23 AM- 9:46 AM) was spent on the date of the encounter performing the following actions: chart review prior to seeing the patient, obtaining history, performing a medically necessary exam, counseling on the treatment plan, placing orders, and documenting in our EHR.   Return precautions advised.  Garret Reddish, MD

## 2020-06-12 ENCOUNTER — Other Ambulatory Visit: Payer: Self-pay

## 2020-06-12 ENCOUNTER — Ambulatory Visit (INDEPENDENT_AMBULATORY_CARE_PROVIDER_SITE_OTHER): Payer: 59 | Admitting: Physician Assistant

## 2020-06-12 ENCOUNTER — Encounter: Payer: Self-pay | Admitting: Physician Assistant

## 2020-06-12 DIAGNOSIS — B356 Tinea cruris: Secondary | ICD-10-CM | POA: Diagnosis not present

## 2020-06-12 DIAGNOSIS — D485 Neoplasm of uncertain behavior of skin: Secondary | ICD-10-CM | POA: Diagnosis not present

## 2020-06-12 MED ORDER — CICLOPIROX OLAMINE 0.77 % EX CREA: TOPICAL_CREAM | Freq: Two times a day (BID) | CUTANEOUS | 2 refills | Status: AC

## 2020-06-12 NOTE — Progress Notes (Signed)
   New Patient Visit  Subjective  Brandon Moody is a 34 y.o. male who presents for the following: Skin Problem (new patient concerns with spots on face and left outer knee). Dr. Yong Channel noticed a lesion on the right forehead. It has been there over a year. It sometimes turns white. It is not sore and doesn't itch. He also has a lesion on the left outer thigh. This lesion has been present years. It has grown slowly over that time. It sometimes gives a pinching sensation. PCP also noticed lesion back of right leg. Pt. Was not aware of it.    Objective  Well appearing patient in no apparent distress; mood and affect are within normal limits.  A focused examination was performed including face, legs, groin. Relevant physical exam findings are noted in the Assessment and Plan.  Objective  Side of left leg: Large scarred growth     Objective  Right Forehead: Warty papules forehead     Objective  Left Medial Thigh, Right Medial Thigh: Scaling hyperpigmented plaques inner thighs  Assessment & Plan  Neoplasm of uncertain behavior of skin (2) Side of left leg  Skin / nail biopsy Type of biopsy: tangential   Informed consent: discussed and consent obtained   Timeout: patient name, date of birth, surgical site, and procedure verified   Procedure prep:  Patient was prepped and draped in usual sterile fashion (Non sterile) Prep type:  Chlorhexidine Anesthesia: the lesion was anesthetized in a standard fashion   Anesthetic:  1% lidocaine w/ epinephrine 1-100,000 local infiltration Instrument used: flexible razor blade   Outcome: patient tolerated procedure well   Post-procedure details: wound care instructions given    Specimen 1 - Surgical pathology Differential Diagnosis: dermatofibroma Check Margins: No  Right Forehead  Skin / nail biopsy Type of biopsy: tangential   Informed consent: discussed and consent obtained   Timeout: patient name, date of birth, surgical site,  and procedure verified   Procedure prep:  Patient was prepped and draped in usual sterile fashion (Non sterile) Prep type:  Chlorhexidine Anesthesia: the lesion was anesthetized in a standard fashion   Anesthetic:  1% lidocaine w/ epinephrine 1-100,000 local infiltration Instrument used: flexible razor blade   Outcome: patient tolerated procedure well   Post-procedure details: wound care instructions given    Specimen 2 - Surgical pathology Differential Diagnosis: wart Check Margins: No  Tinea cruris (2) Left Medial Thigh; Right Medial Thigh  Ordered Medications: ciclopirox (LOPROX) 0.77 % cream

## 2020-06-12 NOTE — Patient Instructions (Signed)

## 2022-07-27 ENCOUNTER — Encounter: Payer: Self-pay | Admitting: *Deleted

## 2022-10-15 ENCOUNTER — Encounter: Payer: Self-pay | Admitting: *Deleted
